# Patient Record
Sex: Female | Born: 1940 | Race: White | Hispanic: No | Marital: Married | Smoking: Never smoker
Health system: Southern US, Community
[De-identification: ages and names within clinical notes are randomized; demographics above are authoritative.]

## PROBLEM LIST (undated history)

## (undated) DIAGNOSIS — S83209A Unspecified tear of unspecified meniscus, current injury, unspecified knee, initial encounter: Secondary | ICD-10-CM

## (undated) DIAGNOSIS — H269 Unspecified cataract: Secondary | ICD-10-CM

## (undated) DIAGNOSIS — M1990 Unspecified osteoarthritis, unspecified site: Secondary | ICD-10-CM

## (undated) DIAGNOSIS — K219 Gastro-esophageal reflux disease without esophagitis: Secondary | ICD-10-CM

## (undated) DIAGNOSIS — IMO0001 Reserved for inherently not codable concepts without codable children: Secondary | ICD-10-CM

## (undated) HISTORY — PX: ABDOMINAL HYSTERECTOMY: SHX81

## (undated) HISTORY — DX: Unspecified cataract: H26.9

---

## 1998-12-22 ENCOUNTER — Other Ambulatory Visit: Admission: RE | Admit: 1998-12-22 | Discharge: 1998-12-22 | Payer: Self-pay | Admitting: Obstetrics and Gynecology

## 2000-02-09 ENCOUNTER — Other Ambulatory Visit: Admission: RE | Admit: 2000-02-09 | Discharge: 2000-02-09 | Payer: Self-pay | Admitting: Obstetrics and Gynecology

## 2000-05-11 ENCOUNTER — Inpatient Hospital Stay (HOSPITAL_COMMUNITY): Admission: RE | Admit: 2000-05-11 | Discharge: 2000-05-15 | Payer: Self-pay | Admitting: Obstetrics and Gynecology

## 2000-05-13 ENCOUNTER — Encounter: Payer: Self-pay | Admitting: Obstetrics and Gynecology

## 2001-09-12 ENCOUNTER — Other Ambulatory Visit: Admission: RE | Admit: 2001-09-12 | Discharge: 2001-09-12 | Payer: Self-pay | Admitting: Obstetrics and Gynecology

## 2004-08-25 ENCOUNTER — Other Ambulatory Visit: Admission: RE | Admit: 2004-08-25 | Discharge: 2004-08-25 | Payer: Self-pay | Admitting: Obstetrics and Gynecology

## 2004-08-25 ENCOUNTER — Encounter: Admission: RE | Admit: 2004-08-25 | Discharge: 2004-08-25 | Payer: Self-pay | Admitting: Obstetrics and Gynecology

## 2004-09-09 ENCOUNTER — Other Ambulatory Visit: Admission: RE | Admit: 2004-09-09 | Discharge: 2004-09-09 | Payer: Self-pay | Admitting: Diagnostic Radiology

## 2004-09-09 ENCOUNTER — Encounter: Admission: RE | Admit: 2004-09-09 | Discharge: 2004-09-09 | Payer: Self-pay | Admitting: General Surgery

## 2012-04-24 DIAGNOSIS — M25569 Pain in unspecified knee: Secondary | ICD-10-CM | POA: Diagnosis not present

## 2012-07-10 DIAGNOSIS — M25569 Pain in unspecified knee: Secondary | ICD-10-CM | POA: Diagnosis not present

## 2012-07-18 DIAGNOSIS — Z23 Encounter for immunization: Secondary | ICD-10-CM | POA: Diagnosis not present

## 2012-07-24 DIAGNOSIS — M25569 Pain in unspecified knee: Secondary | ICD-10-CM | POA: Diagnosis not present

## 2012-07-31 DIAGNOSIS — IMO0002 Reserved for concepts with insufficient information to code with codable children: Secondary | ICD-10-CM | POA: Diagnosis not present

## 2012-09-25 DIAGNOSIS — M25569 Pain in unspecified knee: Secondary | ICD-10-CM | POA: Diagnosis not present

## 2012-11-02 ENCOUNTER — Ambulatory Visit (INDEPENDENT_AMBULATORY_CARE_PROVIDER_SITE_OTHER): Payer: Medicare Other | Admitting: Family Medicine

## 2012-11-02 VITALS — BP 130/80 | HR 66 | Temp 97.2°F | Resp 16 | Ht 62.0 in | Wt 121.4 lb

## 2012-11-02 DIAGNOSIS — H9390 Unspecified disorder of ear, unspecified ear: Secondary | ICD-10-CM | POA: Diagnosis not present

## 2012-11-02 DIAGNOSIS — H9191 Unspecified hearing loss, right ear: Secondary | ICD-10-CM

## 2012-11-02 DIAGNOSIS — H6121 Impacted cerumen, right ear: Secondary | ICD-10-CM

## 2012-11-02 DIAGNOSIS — H6120 Impacted cerumen, unspecified ear: Secondary | ICD-10-CM | POA: Diagnosis not present

## 2012-11-02 NOTE — Progress Notes (Signed)
  [facility]:  Office Visit  Chief Complaint:  Chief Complaint  Patient presents with  . Cough  . Cerumen Impaction    x 2 days    HPI: Bonnie Cowan is a 72 y.o. female who complains of  2 day history of loss of hearing. No dizziness. She has a h/o ringing in her ears but that has not changed. No HA, no gait changes. Recent URI with a lot of coughing. Nothing makes it worse or better. She has not tried anything for it. Denies ear pain.   History reviewed. No pertinent past medical history. Past Surgical History  Procedure Laterality Date  . Abdominal hysterectomy     History   Social History  . Marital Status: Married    Spouse Name: N/A    Number of Children: N/A  . Years of Education: N/A   Social History Main Topics  . Smoking status: Never Smoker   . Smokeless tobacco: None  . Alcohol Use: None  . Drug Use: None  . Sexually Active: None   Other Topics Concern  . None   Social History Narrative  . None   Family History  Problem Relation Age of Onset  . Heart disease Mother    No Known Allergies Prior to Admission medications   Not on File     ROS: The patient denies fevers, chills, night sweats, unintentional weight loss, chest pain, palpitations, wheezing, dyspnea on exertion, nausea, vomiting, abdominal pain, dysuria, hematuria, melena, numbness, weakness, or tingling.   All other systems have been reviewed and were otherwise negative with the exception of those mentioned in the HPI and as above.    PHYSICAL EXAM: Filed Vitals:   11/02/12 1442  BP: 130/80  Pulse: 66  Temp: 97.2 F (36.2 C)  Resp: 16   Filed Vitals:   11/02/12 1442  Height: 5\' 2"  (1.575 m)  Weight: 121 lb 6.4 oz (55.067 kg)   Body mass index is 22.2 kg/(m^2).  General: Alert, no acute distress HEENT:  Normocephalic, atraumatic, oropharynx patent. Right ear cerumen. S/p removal Tm slightly ruptured.  Cardiovascular:  Regular rate and rhythm, no rubs murmurs  or gallops.  No Carotid bruits, radial pulse intact. No pedal edema.  Respiratory: Clear to auscultation bilaterally.  No wheezes, rales, or rhonchi.  No cyanosis, no use of accessory musculature GI: No organomegaly, abdomen is soft and non-tender, positive bowel sounds.  No masses. Skin: No rashes. Neurologic: Facial musculature symmetric. Psychiatric: Patient is appropriate throughout our interaction. Lymphatic: No cervical lymphadenopathy Musculoskeletal: Gait intact.   LABS: No results found for this or any previous visit.   EKG/XRAY:   Primary read interpreted by Dr. Conley Rolls at [facility].   ASSESSMENT/PLAN: Encounter Diagnoses  Name Primary?  . Hearing loss of right ear Yes  . Cerumen impaction, right    Patient declined hearing test Wax removed without complications Hears better although still failed whisper test F/u prn    LE, THAO PHUONG, DO 11/02/2012 3:20 PM

## 2012-11-26 DIAGNOSIS — Z1231 Encounter for screening mammogram for malignant neoplasm of breast: Secondary | ICD-10-CM | POA: Diagnosis not present

## 2012-11-26 DIAGNOSIS — Z780 Asymptomatic menopausal state: Secondary | ICD-10-CM | POA: Diagnosis not present

## 2012-12-31 DIAGNOSIS — M25569 Pain in unspecified knee: Secondary | ICD-10-CM | POA: Diagnosis not present

## 2013-01-01 DIAGNOSIS — H1130 Conjunctival hemorrhage, unspecified eye: Secondary | ICD-10-CM | POA: Diagnosis not present

## 2013-02-26 DIAGNOSIS — H2589 Other age-related cataract: Secondary | ICD-10-CM | POA: Diagnosis not present

## 2013-02-26 DIAGNOSIS — H43399 Other vitreous opacities, unspecified eye: Secondary | ICD-10-CM | POA: Diagnosis not present

## 2013-02-26 DIAGNOSIS — H1045 Other chronic allergic conjunctivitis: Secondary | ICD-10-CM | POA: Diagnosis not present

## 2013-04-03 DIAGNOSIS — M25569 Pain in unspecified knee: Secondary | ICD-10-CM | POA: Diagnosis not present

## 2013-05-07 DIAGNOSIS — Z23 Encounter for immunization: Secondary | ICD-10-CM | POA: Diagnosis not present

## 2013-06-20 DIAGNOSIS — IMO0002 Reserved for concepts with insufficient information to code with codable children: Secondary | ICD-10-CM | POA: Diagnosis not present

## 2013-09-25 DIAGNOSIS — IMO0002 Reserved for concepts with insufficient information to code with codable children: Secondary | ICD-10-CM | POA: Diagnosis not present

## 2014-01-01 DIAGNOSIS — M25569 Pain in unspecified knee: Secondary | ICD-10-CM | POA: Diagnosis not present

## 2014-01-01 DIAGNOSIS — Z5189 Encounter for other specified aftercare: Secondary | ICD-10-CM | POA: Diagnosis not present

## 2014-01-02 ENCOUNTER — Encounter (HOSPITAL_COMMUNITY): Payer: Self-pay | Admitting: Emergency Medicine

## 2014-01-02 ENCOUNTER — Inpatient Hospital Stay (HOSPITAL_COMMUNITY)
Admission: EM | Admit: 2014-01-02 | Discharge: 2014-01-05 | DRG: 395 | Disposition: A | Discharge status: Home / Self Care | Payer: Medicare Other | Attending: Internal Medicine | Admitting: Internal Medicine

## 2014-01-02 DIAGNOSIS — K529 Noninfective gastroenteritis and colitis, unspecified: Secondary | ICD-10-CM | POA: Diagnosis present

## 2014-01-02 DIAGNOSIS — R197 Diarrhea, unspecified: Secondary | ICD-10-CM | POA: Diagnosis not present

## 2014-01-02 DIAGNOSIS — K559 Vascular disorder of intestine, unspecified: Secondary | ICD-10-CM | POA: Diagnosis not present

## 2014-01-02 DIAGNOSIS — Z7982 Long term (current) use of aspirin: Secondary | ICD-10-CM

## 2014-01-02 DIAGNOSIS — K5289 Other specified noninfective gastroenteritis and colitis: Secondary | ICD-10-CM | POA: Diagnosis not present

## 2014-01-02 HISTORY — DX: Unspecified tear of unspecified meniscus, current injury, unspecified knee, initial encounter: S83.209A

## 2014-01-02 LAB — COMPREHENSIVE METABOLIC PANEL
ALK PHOS: 71 U/L (ref 39–117)
ALT: 15 U/L (ref 0–35)
AST: 16 U/L (ref 0–37)
Albumin: 4 g/dL (ref 3.5–5.2)
BUN: 29 mg/dL — ABNORMAL HIGH (ref 6–23)
CO2: 19 mEq/L (ref 19–32)
Calcium: 10.5 mg/dL (ref 8.4–10.5)
Chloride: 100 mEq/L (ref 96–112)
Creatinine, Ser: 0.66 mg/dL (ref 0.50–1.10)
GFR calc non Af Amer: 86 mL/min — ABNORMAL LOW (ref >90)
GLUCOSE: 194 mg/dL — AB (ref 70–99)
POTASSIUM: 4.1 meq/L (ref 3.7–5.3)
Sodium: 139 mEq/L (ref 137–147)
TOTAL PROTEIN: 7.1 g/dL (ref 6.0–8.3)
Total Bilirubin: 1.3 mg/dL — ABNORMAL HIGH (ref 0.3–1.2)

## 2014-01-02 LAB — CBC WITH DIFFERENTIAL/PLATELET
Basophils Absolute: 0 10*3/uL (ref 0.0–0.1)
Basophils Relative: 0 % (ref 0–1)
EOS ABS: 0 10*3/uL (ref 0.0–0.7)
Eosinophils Relative: 0 % (ref 0–5)
HCT: 42.4 % (ref 36.0–46.0)
HEMOGLOBIN: 14.4 g/dL (ref 12.0–15.0)
LYMPHS PCT: 8 % — AB (ref 12–46)
Lymphs Abs: 1.3 10*3/uL (ref 0.7–4.0)
MCH: 29.9 pg (ref 26.0–34.0)
MCHC: 34 g/dL (ref 30.0–36.0)
MCV: 88.1 fL (ref 78.0–100.0)
MONOS PCT: 5 % (ref 3–12)
Monocytes Absolute: 0.8 10*3/uL (ref 0.1–1.0)
NEUTROS PCT: 87 % — AB (ref 43–77)
Neutro Abs: 13.1 10*3/uL — ABNORMAL HIGH (ref 1.7–7.7)
PLATELETS: 324 10*3/uL (ref 150–400)
RBC: 4.81 MIL/uL (ref 3.87–5.11)
RDW: 12.8 % (ref 11.5–15.5)
WBC: 15.2 10*3/uL — AB (ref 4.0–10.5)

## 2014-01-02 LAB — LIPASE, BLOOD: Lipase: 25 U/L (ref 11–59)

## 2014-01-02 MED ORDER — SODIUM CHLORIDE 0.9 % IV BOLUS (SEPSIS)
1000.0000 mL | Freq: Once | INTRAVENOUS | Status: AC
  Administered 2014-01-03: 1000 mL via INTRAVENOUS

## 2014-01-02 MED ORDER — IOHEXOL 300 MG/ML  SOLN
25.0000 mL | Freq: Once | INTRAMUSCULAR | Status: AC | PRN
  Administered 2014-01-02: 25 mL via ORAL

## 2014-01-02 MED ORDER — ONDANSETRON 4 MG PO TBDP
8.0000 mg | ORAL_TABLET | Freq: Once | ORAL | Status: AC
  Administered 2014-01-02: 8 mg via ORAL
  Filled 2014-01-02: qty 2

## 2014-01-02 NOTE — ED Notes (Signed)
Pt placed in gown and in bed. Pt monitored by 5-lead, bp cuff, and pulse ox.

## 2014-01-02 NOTE — ED Notes (Signed)
Dr. Canary Brim at the bedside.

## 2014-01-02 NOTE — ED Notes (Addendum)
Pt states she has been nauseated all day since 0400 this morning and each time she goes to the bathroom it is runny stool. States there was some bright red blood in the commode as well. C/o some lower abd cramping as well.

## 2014-01-02 NOTE — ED Provider Notes (Signed)
CSN: [id number]     Arrival date & time 01/02/14  1844 History   First MD Initiated Contact with Patient 01/02/14 2302     Chief Complaint  Patient presents with  . Nausea  . Diarrhea     (Consider location/radiation/quality/duration/timing/severity/associated sxs/prior Treatment) HPI Pt presents with c/o nausea and diarrhea.  She has had generalied fatigue associated with these symptoms.  Today she saw some blood in her stool which prompted ED visit. Symptoms began this morning and 4am and have been constant.  She states she has passed large amounts of liquid stool today.  No fever/chills.  No vomiting, has been able to drink liquids,  Intermittent cramping abdominal pain.  No recent travel or sick contacts.  Has not had any treatment prior to arrival.  There are no other associated systemic symptoms, there are no other alleviating or modifying factors.   Past Medical History  Diagnosis Date  . Meniscus tear     right knee   Past Surgical History  Procedure Laterality Date  . Abdominal hysterectomy     Family History  Problem Relation Age of Onset  . Heart disease Mother    History  Substance Use Topics  . Smoking status: Never Smoker   . Smokeless tobacco: Not on file  . Alcohol Use: Not on file   OB History   Grav Para Term Preterm Abortions TAB SAB Ect Mult Living                 Review of Systems ROS reviewed and all otherwise negative except for mentioned in HPI    Allergies  Review of patient's allergies indicates no known allergies.  Home Medications   Prior to Admission medications   Medication Sig Start Date End Date Taking? Authorizing Provider  Multiple Vitamin (MULTI VITAMIN DAILY PO) Take 1 tablet by mouth daily.   Yes Historical Provider, MD  acetaminophen (TYLENOL) 500 MG tablet Take 1 tablet (500 mg total) by mouth every 6 (six) hours as needed for mild pain. 01/05/14   Shanker Kristeen Mans, MD  aspirin EC 81 MG tablet Take 1 tablet (81 mg total) by  mouth daily. 01/12/14   Shanker Kristeen Mans, MD  ciprofloxacin (CIPRO) 500 MG tablet Take 1 tablet (500 mg total) by mouth 2 (two) times daily. 01/05/14   Shanker Kristeen Mans, MD  metroNIDAZOLE (FLAGYL) 500 MG tablet Take 1 tablet (500 mg total) by mouth 3 (three) times daily. 01/05/14   Shanker Kristeen Mans, MD  ondansetron (ZOFRAN) 4 MG tablet Take 1 tablet (4 mg total) by mouth every 8 (eight) hours as needed for nausea or vomiting. 01/05/14   Jonetta Osgood, MD   BP 110/40  Pulse 86  Temp(Src) 97.8 F (36.6 C) (Oral)  Resp 15  Ht 5\' 2"  (1.575 m)  Wt 121 lb 0.5 oz (54.9 kg)  BMI 22.13 kg/m2  SpO2 100% Vitals reviewed Physical Exam Physical Examination: General appearance - alert, well appearing, and in no distress Mental status - alert, oriented to person, place, and time Eyes - no scleral icterus, no conjunctival injection Mouth - mucous membranes moist, pharynx normal without lesions Chest - clear to auscultation, no wheezes, rales or rhonchi, symmetric air entry Heart - normal rate, regular rhythm, normal S1, S2, no murmurs, rubs, clicks or gallops Abdomen - soft, ttp in bilateral lower abdomen, no gaurding or rebound tenderness, nondistended, no masses or organomegaly, nabs Extremities - peripheral pulses normal, no pedal edema, no clubbing or cyanosis Skin -  normal coloration and turgor, no rashes  ED Course  Procedures (including critical care time)  2:56 AM d/w [facility], Dr. Alcario Drought for admssion to hospitalist service.  Pt is continuing to feel weak and nauseated, she is agreeable with plan for admission.  Labs Review Labs Reviewed  CBC WITH DIFFERENTIAL - Abnormal; Notable for the following:    WBC 15.2 (*)    Neutrophils Relative % 87 (*)    Neutro Abs 13.1 (*)    Lymphocytes Relative 8 (*)    All other components within normal limits  COMPREHENSIVE METABOLIC PANEL - Abnormal; Notable for the following:    Glucose, Bld 194 (*)    BUN 29 (*)    Total Bilirubin 1.3 (*)    GFR  calc non Af Amer 86 (*)    All other components within normal limits  URINALYSIS, ROUTINE W REFLEX MICROSCOPIC - Abnormal; Notable for the following:    Hgb urine dipstick MODERATE (*)    Protein, ur 30 (*)    All other components within normal limits  LACTIC ACID, PLASMA - Abnormal; Notable for the following:    Lactic Acid, Venous 2.6 (*)    All other components within normal limits  BASIC METABOLIC PANEL - Abnormal; Notable for the following:    Glucose, Bld 130 (*)    GFR calc non Af Amer 89 (*)    All other components within normal limits  CBC - Abnormal; Notable for the following:    WBC 15.0 (*)    All other components within normal limits  BASIC METABOLIC PANEL - Abnormal; Notable for the following:    Potassium 3.5 (*)    Glucose, Bld 108 (*)    GFR calc non Af Amer 88 (*)    All other components within normal limits  CBC - Abnormal; Notable for the following:    WBC 14.2 (*)    Hemoglobin 11.3 (*)    HCT 35.0 (*)    All other components within normal limits  BASIC METABOLIC PANEL - Abnormal; Notable for the following:    Potassium 3.3 (*)    Glucose, Bld 104 (*)    GFR calc non Af Amer 87 (*)    All other components within normal limits  CBC - Abnormal; Notable for the following:    WBC 14.7 (*)    Hemoglobin 11.8 (*)    HCT 35.4 (*)    All other components within normal limits  POC OCCULT BLOOD, ED - Abnormal; Notable for the following:    Fecal Occult Bld POSITIVE (*)    All other components within normal limits  STOOL CULTURE  CLOSTRIDIUM DIFFICILE BY PCR  LIPASE, BLOOD  URINE MICROSCOPIC-ADD ON  MAGNESIUM    Imaging Review No results found.   EKG Interpretation None      MDM   Final diagnoses:  Colitis    Pt presenting with abdominal pain and diarrhea with nausea.  hemocult positive stool.  Abdomen tender.  Abdominal CT scan shows colitis.  Pt is continuing to feel fatigued and nauseated, would prefer admission.  Started on cipro and flagyl.   Family and patient updated at the bedside and they are agreeable with the plan.     Threasa Beards, MD 01/06/14 [phone]

## 2014-01-02 NOTE — ED Notes (Signed)
Patient reports last bloody stool at 1800 this evening,

## 2014-01-03 ENCOUNTER — Emergency Department (HOSPITAL_COMMUNITY): Payer: Medicare Other

## 2014-01-03 ENCOUNTER — Encounter (HOSPITAL_COMMUNITY): Payer: Self-pay | Admitting: Radiology

## 2014-01-03 DIAGNOSIS — Z7982 Long term (current) use of aspirin: Secondary | ICD-10-CM | POA: Diagnosis not present

## 2014-01-03 DIAGNOSIS — R933 Abnormal findings on diagnostic imaging of other parts of digestive tract: Secondary | ICD-10-CM | POA: Diagnosis not present

## 2014-01-03 DIAGNOSIS — K625 Hemorrhage of anus and rectum: Secondary | ICD-10-CM | POA: Diagnosis not present

## 2014-01-03 DIAGNOSIS — K529 Noninfective gastroenteritis and colitis, unspecified: Secondary | ICD-10-CM | POA: Diagnosis present

## 2014-01-03 DIAGNOSIS — K559 Vascular disorder of intestine, unspecified: Secondary | ICD-10-CM | POA: Diagnosis present

## 2014-01-03 DIAGNOSIS — K5289 Other specified noninfective gastroenteritis and colitis: Secondary | ICD-10-CM | POA: Diagnosis not present

## 2014-01-03 DIAGNOSIS — R197 Diarrhea, unspecified: Secondary | ICD-10-CM | POA: Diagnosis not present

## 2014-01-03 LAB — BASIC METABOLIC PANEL
BUN: 18 mg/dL (ref 6–23)
CHLORIDE: 108 meq/L (ref 96–112)
CO2: 20 mEq/L (ref 19–32)
CREATININE: 0.59 mg/dL (ref 0.50–1.10)
Calcium: 8.8 mg/dL (ref 8.4–10.5)
GFR calc non Af Amer: 89 mL/min — ABNORMAL LOW (ref >90)
Glucose, Bld: 130 mg/dL — ABNORMAL HIGH (ref 70–99)
Potassium: 3.9 mEq/L (ref 3.7–5.3)
Sodium: 141 mEq/L (ref 137–147)

## 2014-01-03 LAB — MAGNESIUM: Magnesium: 1.8 mg/dL (ref 1.5–2.5)

## 2014-01-03 LAB — CBC
HCT: 37.6 % (ref 36.0–46.0)
HEMOGLOBIN: 12.4 g/dL (ref 12.0–15.0)
MCH: 29.8 pg (ref 26.0–34.0)
MCHC: 33 g/dL (ref 30.0–36.0)
MCV: 90.4 fL (ref 78.0–100.0)
Platelets: 269 10*3/uL (ref 150–400)
RBC: 4.16 MIL/uL (ref 3.87–5.11)
RDW: 13.1 % (ref 11.5–15.5)
WBC: 15 10*3/uL — ABNORMAL HIGH (ref 4.0–10.5)

## 2014-01-03 LAB — URINALYSIS, ROUTINE W REFLEX MICROSCOPIC
BILIRUBIN URINE: NEGATIVE
Glucose, UA: NEGATIVE mg/dL
KETONES UR: NEGATIVE mg/dL
Leukocytes, UA: NEGATIVE
NITRITE: NEGATIVE
Protein, ur: 30 mg/dL — AB
SPECIFIC GRAVITY, URINE: 1.027 (ref 1.005–1.030)
UROBILINOGEN UA: 0.2 mg/dL (ref 0.0–1.0)
pH: 5.5 (ref 5.0–8.0)

## 2014-01-03 LAB — CLOSTRIDIUM DIFFICILE BY PCR: Toxigenic C. Difficile by PCR: NEGATIVE

## 2014-01-03 LAB — LACTIC ACID, PLASMA: LACTIC ACID, VENOUS: 2.6 mmol/L — AB (ref 0.5–2.2)

## 2014-01-03 LAB — URINE MICROSCOPIC-ADD ON

## 2014-01-03 LAB — POC OCCULT BLOOD, ED: Fecal Occult Bld: POSITIVE — AB

## 2014-01-03 MED ORDER — ONDANSETRON HCL 4 MG/2ML IJ SOLN
4.0000 mg | Freq: Four times a day (QID) | INTRAMUSCULAR | Status: DC | PRN
  Administered 2014-01-03 (×2): 4 mg via INTRAVENOUS
  Filled 2014-01-03 (×2): qty 2

## 2014-01-03 MED ORDER — CIPROFLOXACIN IN D5W 400 MG/200ML IV SOLN
400.0000 mg | Freq: Once | INTRAVENOUS | Status: AC
  Administered 2014-01-03: 400 mg via INTRAVENOUS
  Filled 2014-01-03: qty 200

## 2014-01-03 MED ORDER — PROMETHAZINE HCL 25 MG/ML IJ SOLN
12.5000 mg | Freq: Once | INTRAMUSCULAR | Status: AC
  Administered 2014-01-03: 12.5 mg via INTRAVENOUS
  Filled 2014-01-03: qty 1

## 2014-01-03 MED ORDER — SODIUM CHLORIDE 0.9 % IV SOLN
INTRAVENOUS | Status: DC
  Administered 2014-01-03: 03:00:00 via INTRAVENOUS

## 2014-01-03 MED ORDER — CIPROFLOXACIN HCL 750 MG PO TABS
750.0000 mg | ORAL_TABLET | Freq: Two times a day (BID) | ORAL | Status: DC
  Administered 2014-01-03 – 2014-01-05 (×5): 750 mg via ORAL
  Filled 2014-01-03 (×9): qty 1

## 2014-01-03 MED ORDER — METRONIDAZOLE 500 MG PO TABS
500.0000 mg | ORAL_TABLET | Freq: Three times a day (TID) | ORAL | Status: DC
  Administered 2014-01-03 – 2014-01-05 (×7): 500 mg via ORAL
  Filled 2014-01-03 (×11): qty 1

## 2014-01-03 MED ORDER — SODIUM CHLORIDE 0.9 % IV SOLN: INTRAVENOUS | Status: DC

## 2014-01-03 MED ORDER — HYDROMORPHONE HCL PF 1 MG/ML IJ SOLN
0.5000 mg | INTRAMUSCULAR | Status: DC | PRN
  Administered 2014-01-03: 1 mg via INTRAVENOUS
  Filled 2014-01-03: qty 1

## 2014-01-03 MED ORDER — PROMETHAZINE HCL 25 MG/ML IJ SOLN
12.5000 mg | Freq: Four times a day (QID) | INTRAMUSCULAR | Status: DC | PRN
  Administered 2014-01-03 – 2014-01-04 (×2): 12.5 mg via INTRAVENOUS
  Filled 2014-01-03 (×3): qty 1

## 2014-01-03 MED ORDER — METRONIDAZOLE IN NACL 5-0.79 MG/ML-% IV SOLN
500.0000 mg | Freq: Once | INTRAVENOUS | Status: AC
  Administered 2014-01-03: 500 mg via INTRAVENOUS
  Filled 2014-01-03: qty 100

## 2014-01-03 MED ORDER — SODIUM CHLORIDE 0.9 % IV SOLN
INTRAVENOUS | Status: AC
  Administered 2014-01-03 – 2014-01-04 (×3): via INTRAVENOUS

## 2014-01-03 MED ORDER — ACETAMINOPHEN 500 MG PO TABS: 1000.0000 mg | ORAL_TABLET | Freq: Every day | ORAL | Status: DC | PRN

## 2014-01-03 MED ORDER — SODIUM CHLORIDE 0.9 % IV BOLUS (SEPSIS)
1000.0000 mL | Freq: Once | INTRAVENOUS | Status: AC
  Administered 2014-01-03: 1000 mL via INTRAVENOUS

## 2014-01-03 MED ORDER — IOHEXOL 300 MG/ML  SOLN
100.0000 mL | Freq: Once | INTRAMUSCULAR | Status: AC | PRN
  Administered 2014-01-03: 100 mL via INTRAVENOUS

## 2014-01-03 NOTE — H&P (Signed)
[facility] History and Physical  Bonnie Cowan GNO:[id number] DOB: 19-Feb-1941 DOA: 01/02/2014  Referring physician: EDP PCP: No PCP Per Patient   Chief Complaint: Nausea, diarrhea   HPI: Bonnie Cowan is a 73 y.o. female who presents to the ED with nausea, diarrhea, and some BRBPR as well as lower abdominal cramping.  Symptoms are moderately severe, onset at 0400 in the morning yesterday and have lasted all day without change in severity.  No sick contacts, no recent ABx courses nor hospital stays.  Patient presents to ED as symptoms are not improving.  Review of Systems: Systems reviewed.  As above, otherwise negative  Past Medical History  Diagnosis Date  . Meniscus tear     right knee   Past Surgical History  Procedure Laterality Date  . Abdominal hysterectomy     Social History:  reports that she has never smoked. She does not have any smokeless tobacco history on file. Her alcohol and drug histories are not on file.  No Known Allergies  Family History  Problem Relation Age of Onset  . Heart disease Mother      Prior to Admission medications   Medication Sig Start Date End Date Taking? Authorizing Provider  acetaminophen (TYLENOL) 500 MG tablet Take 1,000 mg by mouth daily as needed for mild pain.   Yes Historical Provider, MD  aspirin EC 81 MG tablet Take 81 mg by mouth daily.   Yes Historical Provider, MD  diphenhydramine-acetaminophen (TYLENOL PM) 25-500 MG TABS Take 1 tablet by mouth at bedtime as needed.   Yes Historical Provider, MD  Multiple Vitamin (MULTI VITAMIN DAILY PO) Take 1 tablet by mouth daily.   Yes Historical Provider, MD   Physical Exam: Filed Vitals:   01/03/14 0300  BP: 160/77  Pulse: 64  Temp:   Resp: 17    BP 160/77  Pulse 64  Temp(Src) 98.8 F (37.1 C) (Oral)  Resp 17  SpO2 95%  General Appearance:    Alert, oriented, no distress, appears stated age  Head:    Normocephalic, atraumatic  Eyes:    PERRL, EOMI, sclera non-icteric        Nose:   Nares without drainage or epistaxis. Mucosa, turbinates normal  Throat:   Moist mucous membranes. Oropharynx without erythema or exudate.  Neck:   Supple. No carotid bruits.  No thyromegaly.  No lymphadenopathy.   Back:     No CVA tenderness, no spinal tenderness  Lungs:     Clear to auscultation bilaterally, without wheezes, rhonchi or rales  Chest wall:    No tenderness to palpitation  Heart:    Regular rate and rhythm without murmurs, gallops, rubs  Abdomen:     Soft, diffuse abdominal tenderness, nondistended, normal bowel sounds, no organomegaly  Genitalia:    deferred  Rectal:    deferred  Extremities:   No clubbing, cyanosis or edema.  Pulses:   2+ and symmetric all extremities  Skin:   Skin color, texture, turgor normal, no rashes or lesions  Lymph nodes:   Cervical, supraclavicular, and axillary nodes normal  Neurologic:   CNII-XII intact. Normal strength, sensation and reflexes      throughout    Labs on Admission:  Basic Metabolic Panel:  Recent Labs Lab 01/02/14 1857  NA 139  K 4.1  CL 100  CO2 19  GLUCOSE 194*  BUN 29*  CREATININE 0.66  CALCIUM 10.5   Liver Function Tests:  Recent Labs Lab 01/02/14 1857  AST 16  ALT 15  ALKPHOS 71  BILITOT 1.3*  PROT 7.1  ALBUMIN 4.0    Recent Labs Lab 01/02/14 1857  LIPASE 25   No results found for this basename: AMMONIA,  in the last 168 hours CBC:  Recent Labs Lab 01/02/14 1857  WBC 15.2*  NEUTROABS 13.1*  HGB 14.4  HCT 42.4  MCV 88.1  PLT 324   Cardiac Enzymes: No results found for this basename: CKTOTAL, CKMB, CKMBINDEX, TROPONINI,  in the last 168 hours  BNP (last 3 results) No results found for this basename: PROBNP,  in the last 8760 hours CBG: No results found for this basename: GLUCAP,  in the last 168 hours  Radiological Exams on Admission: Ct Abdomen Pelvis W Contrast  01/03/2014   CLINICAL DATA:  Low abdominal pain, nausea, diarrhea, bloody stool.  EXAM: CT ABDOMEN AND  PELVIS WITH CONTRAST  TECHNIQUE: Multidetector CT imaging of the abdomen and pelvis was performed using the standard protocol following bolus administration of intravenous contrast.  CONTRAST:  115mL OMNIPAQUE IOHEXOL 300 MG/ML  SOLN  COMPARISON:  None.  FINDINGS: Fibrosis or atelectasis in the lung bases.  There is wall thickening and edema involving the transverse and descending colon. There is associated pericolonic fat stranding. Small amount of free fluid is demonstrated in the pelvis and upper quadrant, likely reactive. Changes are consistent with nonspecific colitis. No pneumatosis or portal venous gas is identified. Changes are likely to represent infectious or inflammatory etiology although inferior mesenteric artery process is not excluded. The inferior mesenteric artery proximally appears patent.  Circumscribed low-attenuation lesion in the lateral segment left lobe of liver measuring 11 mm diameter consistent with a small cyst. No other focal liver lesions. The gallbladder, pancreas, spleen, adrenal glands, kidneys, inferior vena cava, and retroperitoneal lymph nodes are unremarkable. Calcification throughout the aorta and iliac vessels. Stomach and small bowel are not abnormally distended and contrast material flows through to the colon without evidence of obstruction. No free air in the abdomen.  Pelvis: Bladder is decompressed, likely accounting for apparent wall thickening. No evidence of diverticulitis although scattered diverticula are present. Appendix is not identified. No significant pelvic lymphadenopathy. Spondylolysis with mild spondylolisthesis at L4-5. No destructive bone lesions.  IMPRESSION: Diffuse wall thickening and pericolonic infiltration involving the transverse and descending colon consistent with nonspecific colitis, likely to be infectious or inflammatory origin. Small amount of free fluid in the abdomen and pelvis is likely reactive.   Electronically Signed   By: Lucienne Capers M.D.   On: 01/03/2014 01:22    EKG: Independently reviewed.  Assessment/Plan Principal Problem:   Colitis   1. Colitis - Infectious vs ischemic vs inflammatory (less likely as this would be new onset). 1. Lactate ordered and pending 2. Treating empirically as presumed infectious origin with cipro / flagyl IV, convert this to PO later this morning if patient able to tolerate POs. 3. NPO, advance diet to clear liquids when patient ready. 4. zofran and phenergan for nausea, dilaudid for pain. 5. IVF at 125 cc/hr 6. Repeat CBC if patient still here tomorrow to follow leukocytosis.    Code Status: Full Code  Family Communication: Family at bedside Disposition Plan: Admit to obs   Time spent: 50 min  GARDNER, JARED M. [facility] Pager [phone]  If 7AM-7PM, please contact the day team taking care of the patient [email or web address] Password [email or web address] 01/03/2014, 3:11 AM

## 2014-01-03 NOTE — ED Notes (Signed)
Reported to Dr. Canary Brim that patient requests nausea medication.  MD acknowledges, and will input orders.

## 2014-01-03 NOTE — Consult Note (Signed)
Unassigned Consult  Reason for Consult: Hematochezia and abdominal pain Referring Physician: [facility]  Bonnie Cowan HPI: This is a 73 year old female with an acute onset, 4 AM on 01/02/2014, of lower abdominal pain followed by hematochezia.  A CT scan of the ABM/Pelvis reveals a colitis in the distal transverse and proximal descending colon.  The etiology of the colitis is unknown, but she has not responded to the use of antibiotics.  In fact, she reports feeling worse.  Her C. Diff is negative.  No prior issues with hematochezia, recent antibiotic use, and there are no reports of any sick contacts.  She also denies having a prior colonoscopy.  The patient cannot recall the number of blood bowel movements on the day of admission, but she states that she only had two blood bowel movements today.  Past Medical History  Diagnosis Date  . Meniscus tear     right knee    Past Surgical History  Procedure Laterality Date  . Abdominal hysterectomy      Family History  Problem Relation Age of Onset  . Heart disease Mother     Social History:  reports that she has never smoked. She does not have any smokeless tobacco history on file. Her alcohol and drug histories are not on file.  Allergies: No Known Allergies  Medications:  Scheduled: . ciprofloxacin  750 mg Oral BID  . metroNIDAZOLE  500 mg Oral 3 times per day   Continuous: . sodium chloride 100 mL/hr at 01/03/14 1749    Results for orders placed during the hospital encounter of 01/02/14 (from the past 24 hour(s))  CBC WITH DIFFERENTIAL     Status: Abnormal   Collection Time    01/02/14  6:57 PM      Result Value Ref Range   WBC 15.2 (*) 4.0 - 10.5 K/uL   RBC 4.81  3.87 - 5.11 MIL/uL   Hemoglobin 14.4  12.0 - 15.0 g/dL   HCT 42.4  36.0 - 46.0 %   MCV 88.1  78.0 - 100.0 fL   MCH 29.9  26.0 - 34.0 pg   MCHC 34.0  30.0 - 36.0 g/dL   RDW 12.8  11.5 - 15.5 %   Platelets 324  150 - 400 K/uL   Neutrophils Relative % 87  (*) 43 - 77 %   Neutro Abs 13.1 (*) 1.7 - 7.7 K/uL   Lymphocytes Relative 8 (*) 12 - 46 %   Lymphs Abs 1.3  0.7 - 4.0 K/uL   Monocytes Relative 5  3 - 12 %   Monocytes Absolute 0.8  0.1 - 1.0 K/uL   Eosinophils Relative 0  0 - 5 %   Eosinophils Absolute 0.0  0.0 - 0.7 K/uL   Basophils Relative 0  0 - 1 %   Basophils Absolute 0.0  0.0 - 0.1 K/uL  COMPREHENSIVE METABOLIC PANEL     Status: Abnormal   Collection Time    01/02/14  6:57 PM      Result Value Ref Range   Sodium 139  137 - 147 mEq/L   Potassium 4.1  3.7 - 5.3 mEq/L   Chloride 100  96 - 112 mEq/L   CO2 19  19 - 32 mEq/L   Glucose, Bld 194 (*) 70 - 99 mg/dL   BUN 29 (*) 6 - 23 mg/dL   Creatinine, Ser 0.66  0.50 - 1.10 mg/dL   Calcium 10.5  8.4 - 10.5 mg/dL  Total Protein 7.1  6.0 - 8.3 g/dL   Albumin 4.0  3.5 - 5.2 g/dL   AST 16  0 - 37 U/L   ALT 15  0 - 35 U/L   Alkaline Phosphatase 71  39 - 117 U/L   Total Bilirubin 1.3 (*) 0.3 - 1.2 mg/dL   GFR calc non Af Amer 86 (*) >90 mL/min   GFR calc Af Amer >90  >90 mL/min  LIPASE, BLOOD     Status: None   Collection Time    01/02/14  6:57 PM      Result Value Ref Range   Lipase 25  11 - 59 U/L  URINALYSIS, ROUTINE W REFLEX MICROSCOPIC     Status: Abnormal   Collection Time    01/02/14 11:45 PM      Result Value Ref Range   Color, Urine YELLOW  YELLOW   APPearance CLEAR  CLEAR   Specific Gravity, Urine 1.027  1.005 - 1.030   pH 5.5  5.0 - 8.0   Glucose, UA NEGATIVE  NEGATIVE mg/dL   Hgb urine dipstick MODERATE (*) NEGATIVE   Bilirubin Urine NEGATIVE  NEGATIVE   Ketones, ur NEGATIVE  NEGATIVE mg/dL   Protein, ur 30 (*) NEGATIVE mg/dL   Urobilinogen, UA 0.2  0.0 - 1.0 mg/dL   Nitrite NEGATIVE  NEGATIVE   Leukocytes, UA NEGATIVE  NEGATIVE  URINE MICROSCOPIC-ADD ON     Status: None   Collection Time    01/02/14 11:45 PM      Result Value Ref Range   Squamous Epithelial / LPF RARE  RARE   RBC / HPF 3-6  <3 RBC/hpf   Bacteria, UA RARE  RARE  POC OCCULT BLOOD, ED      Status: Abnormal   Collection Time    01/03/14 12:15 AM      Result Value Ref Range   Fecal Occult Bld POSITIVE (*) NEGATIVE  CLOSTRIDIUM DIFFICILE BY PCR     Status: None   Collection Time    01/03/14  1:56 AM      Result Value Ref Range   C difficile by pcr NEGATIVE  NEGATIVE  LACTIC ACID, PLASMA     Status: Abnormal   Collection Time    01/03/14  3:12 AM      Result Value Ref Range   Lactic Acid, Venous 2.6 (*) 0.5 - 2.2 mmol/L  BASIC METABOLIC PANEL     Status: Abnormal   Collection Time    01/03/14  8:05 AM      Result Value Ref Range   Sodium 141  137 - 147 mEq/L   Potassium 3.9  3.7 - 5.3 mEq/L   Chloride 108  96 - 112 mEq/L   CO2 20  19 - 32 mEq/L   Glucose, Bld 130 (*) 70 - 99 mg/dL   BUN 18  6 - 23 mg/dL   Creatinine, Ser 0.59  0.50 - 1.10 mg/dL   Calcium 8.8  8.4 - 10.5 mg/dL   GFR calc non Af Amer 89 (*) >90 mL/min   GFR calc Af Amer >90  >90 mL/min  MAGNESIUM     Status: None   Collection Time    01/03/14  8:05 AM      Result Value Ref Range   Magnesium 1.8  1.5 - 2.5 mg/dL  CBC     Status: Abnormal   Collection Time    01/03/14  8:05 AM      Result Value  Ref Range   WBC 15.0 (*) 4.0 - 10.5 K/uL   RBC 4.16  3.87 - 5.11 MIL/uL   Hemoglobin 12.4  12.0 - 15.0 g/dL   HCT 37.6  36.0 - 46.0 %   MCV 90.4  78.0 - 100.0 fL   MCH 29.8  26.0 - 34.0 pg   MCHC 33.0  30.0 - 36.0 g/dL   RDW 13.1  11.5 - 15.5 %   Platelets 269  150 - 400 K/uL     Ct Abdomen Pelvis W Contrast  01/03/2014   CLINICAL DATA:  Low abdominal pain, nausea, diarrhea, bloody stool.  EXAM: CT ABDOMEN AND PELVIS WITH CONTRAST  TECHNIQUE: Multidetector CT imaging of the abdomen and pelvis was performed using the standard protocol following bolus administration of intravenous contrast.  CONTRAST:  150mL OMNIPAQUE IOHEXOL 300 MG/ML  SOLN  COMPARISON:  None.  FINDINGS: Fibrosis or atelectasis in the lung bases.  There is wall thickening and edema involving the transverse and descending colon. There is  associated pericolonic fat stranding. Small amount of free fluid is demonstrated in the pelvis and upper quadrant, likely reactive. Changes are consistent with nonspecific colitis. No pneumatosis or portal venous gas is identified. Changes are likely to represent infectious or inflammatory etiology although inferior mesenteric artery process is not excluded. The inferior mesenteric artery proximally appears patent.  Circumscribed low-attenuation lesion in the lateral segment left lobe of liver measuring 11 mm diameter consistent with a small cyst. No other focal liver lesions. The gallbladder, pancreas, spleen, adrenal glands, kidneys, inferior vena cava, and retroperitoneal lymph nodes are unremarkable. Calcification throughout the aorta and iliac vessels. Stomach and small bowel are not abnormally distended and contrast material flows through to the colon without evidence of obstruction. No free air in the abdomen.  Pelvis: Bladder is decompressed, likely accounting for apparent wall thickening. No evidence of diverticulitis although scattered diverticula are present. Appendix is not identified. No significant pelvic lymphadenopathy. Spondylolysis with mild spondylolisthesis at L4-5. No destructive bone lesions.  IMPRESSION: Diffuse wall thickening and pericolonic infiltration involving the transverse and descending colon consistent with nonspecific colitis, likely to be infectious or inflammatory origin. Small amount of free fluid in the abdomen and pelvis is likely reactive.   Electronically Signed   By: Lucienne Capers M.D.   On: 01/03/2014 01:22    ROS:  As stated above in the HPI otherwise negative.  Blood pressure 142/69, pulse 68, temperature 98.8 F (37.1 C), temperature source Oral, resp. rate 16, height 5\' 2"  (1.575 m), weight 121 lb 0.5 oz (54.9 kg), SpO2 97.00%.    PE: Gen: NAD, Alert and Oriented HEENT:  [place]/AT, EOMI Neck: Supple, no LAD Lungs: CTA Bilaterally CV: RRR without M/G/R ABM:  Soft, NTND, +BS Ext: No C/C/E  Assessment/Plan: 1) Probable ischemic colitis. 2) Weakness. 3) Leukocytosis.   The patient's abdomen is benign at this time and she is stable, but weak appearing.  My contention is that she has an ischemic colitis and the only way to find out is with an endoscopic examination, however, she does not feel that she can prep for the examination.    Plan: 1) Continue with antibiotics for now. 2) ? Colonoscopy this weekend versus outpatient follow up.  This will depend on her clinical progress. Jeanise Durfey D 01/03/2014, 10:36 AM

## 2014-01-03 NOTE — ED Notes (Signed)
Patient currently drinking contrast.  

## 2014-01-03 NOTE — Progress Notes (Signed)
Patient Demographics  Bonnie Cowan, is a 73 y.o. female, DOB - [birth year], VWU:[id number]  Admit date - 01/02/2014   Admitting Physician Etta Quill, DO  Outpatient Primary MD for the patient is No PCP Per Patient  LOS - 1   Chief Complaint  Patient presents with  . Nausea  . Diarrhea           Subjective:   [name] today has, No headache, No chest pain, i.e. generalized abdominal pain - but has persistent Nausea with no emesis but positive diarrhea, some blood and mucus in stool, No new weakness tingling or numbness, No Cough - SOB.      Assessment & Plan    1. Severe colitis of unclear etiology. Stool cultures pending, continue bowel rest, IV fluids, IV Cipro Flagyl, remote possibility that this could be inflammatory bowel versus ischemic. Have requested GI physician Dr. hung to evaluate. Continue supportive care for now.      Code Status: Full  Family Communication: None present  Disposition Plan: Most likely home   Procedures CT scan abdomen pelvis   Consults  GI Dr. Benson [place]   Medications  Scheduled Meds: . ciprofloxacin  750 mg Oral BID  . metroNIDAZOLE  500 mg Oral 3 times per day   Continuous Infusions: . sodium chloride 100 mL/hr at 01/03/14 0713   PRN Meds:.acetaminophen, HYDROmorphone (DILAUDID) injection, ondansetron (ZOFRAN) IV, promethazine  DVT Prophylaxis  SCDs    Lab Results  Component Value Date   PLT 269 01/03/2014    Antibiotics     Anti-infectives   Start     Dose/Rate Route Frequency Ordered Stop   01/03/14 1400  metroNIDAZOLE (FLAGYL) tablet 500 mg     500 mg Oral 3 times per day 01/03/14 0255     01/03/14 0800  ciprofloxacin (CIPRO) tablet 750 mg     750 mg Oral 2 times daily 01/03/14 0255     01/03/14 0145  ciprofloxacin (CIPRO) IVPB  400 mg     400 mg 200 mL/hr over 60 Minutes Intravenous  Once 01/03/14 0141 01/03/14 0306   01/03/14 0145  metroNIDAZOLE (FLAGYL) IVPB 500 mg     500 mg 100 mL/hr over 60 Minutes Intravenous  Once 01/03/14 0141 01/03/14 0306          Objective:   Filed Vitals:   01/03/14 0300 01/03/14 0315 01/03/14 0319 01/03/14 0550  BP: 160/77 156/75 171/73 142/69  Pulse: 64 60 64 68  Temp:   98.6 F (37 C) 98.8 F (37.1 C)  TempSrc:   Oral Oral  Resp: 17 19 18 16   Height:   5\' 2"  (1.575 m)   Weight:   54.9 kg (121 lb 0.5 oz)   SpO2: 95% 96% 98% 97%    Wt Readings from Last 3 Encounters:  01/03/14 54.9 kg (121 lb 0.5 oz)  11/02/12 55.067 kg (121 lb 6.4 oz)     Intake/Output Summary (Last 24 hours) at 01/03/14 0953 Last data filed at 01/03/14 0803  Gross per 24 hour  Intake 2364.58 ml  Output    800 ml  Net 1564.58 ml     Physical Exam  Awake Alert, Oriented X 3, No new F.N deficits, Normal affect .AT,PERRAL Supple  Neck,No JVD, No cervical lymphadenopathy appriciated.  Symmetrical Chest wall movement, Good air movement bilaterally, CTAB RRR,No Gallops,Rubs or new Murmurs, No Parasternal Heave +ve B.Sounds, Abd Soft, No tenderness, No organomegaly appriciated, No rebound - guarding or rigidity. Abdominal exam is benign. No Cyanosis, Clubbing or edema, No new Rash or bruise      Data Review   Micro Results Recent Results (from the past 240 hour(s))  CLOSTRIDIUM DIFFICILE BY PCR     Status: None   Collection Time    01/03/14  1:56 AM      Result Value Ref Range Status   C difficile by pcr NEGATIVE  NEGATIVE Final    Radiology Reports Ct Abdomen Pelvis W Contrast  01/03/2014   CLINICAL DATA:  Low abdominal pain, nausea, diarrhea, bloody stool.  EXAM: CT ABDOMEN AND PELVIS WITH CONTRAST  TECHNIQUE: Multidetector CT imaging of the abdomen and pelvis was performed using the standard protocol following bolus administration of intravenous contrast.  CONTRAST:  151mL  OMNIPAQUE IOHEXOL 300 MG/ML  SOLN  COMPARISON:  None.  FINDINGS: Fibrosis or atelectasis in the lung bases.  There is wall thickening and edema involving the transverse and descending colon. There is associated pericolonic fat stranding. Small amount of free fluid is demonstrated in the pelvis and upper quadrant, likely reactive. Changes are consistent with nonspecific colitis. No pneumatosis or portal venous gas is identified. Changes are likely to represent infectious or inflammatory etiology although inferior mesenteric artery process is not excluded. The inferior mesenteric artery proximally appears patent.  Circumscribed low-attenuation lesion in the lateral segment left lobe of liver measuring 11 mm diameter consistent with a small cyst. No other focal liver lesions. The gallbladder, pancreas, spleen, adrenal glands, kidneys, inferior vena cava, and retroperitoneal lymph nodes are unremarkable. Calcification throughout the aorta and iliac vessels. Stomach and small bowel are not abnormally distended and contrast material flows through to the colon without evidence of obstruction. No free air in the abdomen.  Pelvis: Bladder is decompressed, likely accounting for apparent wall thickening. No evidence of diverticulitis although scattered diverticula are present. Appendix is not identified. No significant pelvic lymphadenopathy. Spondylolysis with mild spondylolisthesis at L4-5. No destructive bone lesions.  IMPRESSION: Diffuse wall thickening and pericolonic infiltration involving the transverse and descending colon consistent with nonspecific colitis, likely to be infectious or inflammatory origin. Small amount of free fluid in the abdomen and pelvis is likely reactive.   Electronically Signed   By: Lucienne Capers M.D.   On: 01/03/2014 01:22    CBC  Recent Labs Lab 01/02/14 1857 01/03/14 0805  WBC 15.2* 15.0*  HGB 14.4 12.4  HCT 42.4 37.6  PLT 324 269  MCV 88.1 90.4  MCH 29.9 29.8  MCHC 34.0  33.0  RDW 12.8 13.1  LYMPHSABS 1.3  --   MONOABS 0.8  --   EOSABS 0.0  --   BASOSABS 0.0  --     Chemistries   Recent Labs Lab 01/02/14 1857 01/03/14 0805  NA 139 141  K 4.1 3.9  CL 100 108  CO2 19 20  GLUCOSE 194* 130*  BUN 29* 18  CREATININE 0.66 0.59  CALCIUM 10.5 8.8  MG  --  1.8  AST 16  --   ALT 15  --   ALKPHOS 71  --   BILITOT 1.3*  --    ------------------------------------------------------------------------------------------------------------------ estimated creatinine clearance is 49.5 ml/min (by C-G formula based on Cr of 0.59). ------------------------------------------------------------------------------------------------------------------ No results found for this basename: HGBA1C,  in the last 72 hours ------------------------------------------------------------------------------------------------------------------ No results found for this basename: CHOL, HDL, LDLCALC, TRIG, CHOLHDL, LDLDIRECT,  in the last 72 hours ------------------------------------------------------------------------------------------------------------------ No results found for this basename: TSH, T4TOTAL, FREET3, T3FREE, THYROIDAB,  in the last 72 hours ------------------------------------------------------------------------------------------------------------------ No results found for this basename: VITAMINB12, FOLATE, FERRITIN, TIBC, IRON, RETICCTPCT,  in the last 72 hours  Coagulation profile No results found for this basename: INR, PROTIME,  in the last 168 hours  No results found for this basename: DDIMER,  in the last 72 hours  Cardiac Enzymes No results found for this basename: CK, CKMB, TROPONINI, MYOGLOBIN,  in the last 168 hours ------------------------------------------------------------------------------------------------------------------ No components found with this basename: POCBNP,      Time Spent in minutes   35   SINGH,PRASHANT K M.D on 01/03/2014 at  9:53 AM  Between 7am to 7pm - Pager - [phone]  After 7pm go to [email or web address] - password [email or web address]  And look for the night coverage person covering for me after hours  [facility] Office  [phone]   **Disclaimer: This note may have been dictated with voice recognition software. Similar sounding words can inadvertently be transcribed and this note may contain transcription errors which may not have been corrected upon publication of note.**

## 2014-01-03 NOTE — ED Notes (Signed)
Dr. Alcario Drought, Hospitalist, at the bedside.

## 2014-01-03 NOTE — ED Notes (Signed)
Clarified with Dr. Alcario Drought. Patient is to receive 129mL of NS.  Duplicate order was entered.

## 2014-01-03 NOTE — ED Notes (Signed)
Updated patient and family on the plan of care.

## 2014-01-03 NOTE — ED Notes (Signed)
Reported to Dr. Hetty Blend that hemocult has been sent, with bright red blood on inspection.  In and out cath collected since patient has diarrhea.  No need for 2nd sample of urine at this time .

## 2014-01-03 NOTE — Progress Notes (Signed)
Utilization review completed.  

## 2014-01-04 LAB — CBC
HCT: 35 % — ABNORMAL LOW (ref 36.0–46.0)
Hemoglobin: 11.3 g/dL — ABNORMAL LOW (ref 12.0–15.0)
MCH: 29 pg (ref 26.0–34.0)
MCHC: 32.3 g/dL (ref 30.0–36.0)
MCV: 90 fL (ref 78.0–100.0)
PLATELETS: 251 10*3/uL (ref 150–400)
RBC: 3.89 MIL/uL (ref 3.87–5.11)
RDW: 13.1 % (ref 11.5–15.5)
WBC: 14.2 10*3/uL — ABNORMAL HIGH (ref 4.0–10.5)

## 2014-01-04 LAB — BASIC METABOLIC PANEL
BUN: 11 mg/dL (ref 6–23)
CALCIUM: 8.7 mg/dL (ref 8.4–10.5)
CO2: 22 meq/L (ref 19–32)
CREATININE: 0.61 mg/dL (ref 0.50–1.10)
Chloride: 106 mEq/L (ref 96–112)
GFR calc Af Amer: >90 mL/min (ref >90)
GFR, EST NON AFRICAN AMERICAN: 88 mL/min — AB (ref >90)
GLUCOSE: 108 mg/dL — AB (ref 70–99)
Potassium: 3.5 mEq/L — ABNORMAL LOW (ref 3.7–5.3)
Sodium: 141 mEq/L (ref 137–147)

## 2014-01-04 NOTE — Progress Notes (Signed)
I agree with the above documentation, including the assessment and plan. Slowly improving, expect ischemic colitis Supportive care, outpatient colonoscopy

## 2014-01-04 NOTE — Progress Notes (Signed)
Progress Note  Covering for Dr. Benson [place]   Subjective  still has loose stool but no bleeding this am. Nauseated, especially when trying to get out of bed   Objective   Vital signs in last 24 hours: Temp:  [98.5 F (36.9 C)-98.7 F (37.1 C)] 98.5 F (36.9 C) (06/26 2159) Pulse Rate:  [63-66] 66 (06/27 0711) Resp:  [16] 16 (06/27 0711) BP: (142-165)/(68-74) 142/68 mmHg (06/27 0711) SpO2:  [96 %-98 %] 98 % (06/27 0711) Last BM Date: 01/04/14 General:    white female in NAD Heart:  Regular rate and rhythm Abdomen:  Soft, nontender and nondistended. Normal bowel sounds. Extremities:  Without edema. Neurologic:  Alert and oriented,  grossly normal neurologically. Psych:  Cooperative. Normal mood and affect.  Lab Results:  Recent Labs  01/02/14 1857 01/03/14 0805 01/04/14 0450  WBC 15.2* 15.0* 14.2*  HGB 14.4 12.4 11.3*  HCT 42.4 37.6 35.0*  PLT 324 269 251   BMET  Recent Labs  01/02/14 1857 01/03/14 0805 01/04/14 0450  NA 139 141 141  K 4.1 3.9 3.5*  CL 100 108 106  CO2 19 20 22   GLUCOSE 194* 130* 108*  BUN 29* 18 11  CREATININE 0.66 0.59 0.61  CALCIUM 10.5 8.8 8.7   LFT  Recent Labs  01/02/14 1857  PROT 7.1  ALBUMIN 4.0  AST 16  ALT 15  ALKPHOS 71  BILITOT 1.3*    Studies/Results: Ct Abdomen Pelvis W Contrast  01/03/2014   CLINICAL DATA:  Low abdominal pain, nausea, diarrhea, bloody stool.  EXAM: CT ABDOMEN AND PELVIS WITH CONTRAST  TECHNIQUE: Multidetector CT imaging of the abdomen and pelvis was performed using the standard protocol following bolus administration of intravenous contrast.  CONTRAST:  110mL OMNIPAQUE IOHEXOL 300 MG/ML  SOLN  COMPARISON:  None.  FINDINGS: Fibrosis or atelectasis in the lung bases.  There is wall thickening and edema involving the transverse and descending colon. There is associated pericolonic fat stranding. Small amount of free fluid is demonstrated in the pelvis and upper quadrant, likely reactive. Changes are  consistent with nonspecific colitis. No pneumatosis or portal venous gas is identified. Changes are likely to represent infectious or inflammatory etiology although inferior mesenteric artery process is not excluded. The inferior mesenteric artery proximally appears patent.  Circumscribed low-attenuation lesion in the lateral segment left lobe of liver measuring 11 mm diameter consistent with a small cyst. No other focal liver lesions. The gallbladder, pancreas, spleen, adrenal glands, kidneys, inferior vena cava, and retroperitoneal lymph nodes are unremarkable. Calcification throughout the aorta and iliac vessels. Stomach and small bowel are not abnormally distended and contrast material flows through to the colon without evidence of obstruction. No free air in the abdomen.  Pelvis: Bladder is decompressed, likely accounting for apparent wall thickening. No evidence of diverticulitis although scattered diverticula are present. Appendix is not identified. No significant pelvic lymphadenopathy. Spondylolysis with mild spondylolisthesis at L4-5. No destructive bone lesions.  IMPRESSION: Diffuse wall thickening and pericolonic infiltration involving the transverse and descending colon consistent with nonspecific colitis, likely to be infectious or inflammatory origin. Small amount of free fluid in the abdomen and pelvis is likely reactive.   Electronically Signed   By: Lucienne Capers M.D.   On: 01/03/2014 01:22     Assessment / Plan:   Colitis, distal transverse/proximal descending colon. C-diff negative. Stool culture pending.  Infectious etiology possible. Ischemic colitis also diagnostic consideration. On cipro / flagyl. WBC down slightly and symptoms improved. Continue  antibiotics, supportive care. Never had a colonoscopy but this can be done outpatient is she continues to improve. Admitting team advanced to fulls today, will see how she does. Encouraged her to ask for prn anti-emetics.     LOS: 2 days    Tye Savoy  01/04/2014, 10:23 AM

## 2014-01-04 NOTE — Progress Notes (Addendum)
PATIENT DETAILS Name: Bonnie Cowan Age: 73 y.o. Sex: female Date of Birth: 1941/04/17 Admit Date: 01/02/2014 Admitting Physician Etta Quill, DO PCP:No PCP Per Patient  Subjective: Still having intermittent hematochezia. Diarrhea and abd pain has improved  Assessment/Plan: Principal Problem:   Colitis - Etiology felt to be either infectious or ischemic. Admitted and empirically started on Cipro/Flagyl.C Diff PCR negative. Leukocytosis improving, also improving clinically. Abdomen is soft with very minimal tenderness on the left mid quadrant area. Advanced diet to full liquids today, continue with supportive measures. Follow clinically. Per GI, colonoscopy can be done in the outpatient setting.  Disposition: Remain inpatient  DVT Prophylaxis:  SCD's  Code Status: Full code   Family Communication None at bedside  Procedures:  none  CONSULTS:  GI  Time spent 40 minutes-which includes 50% of the time with face-to-face with patient/ family and coordinating care related to the above assessment and plan.    MEDICATIONS: Scheduled Meds: . ciprofloxacin  750 mg Oral BID  . metroNIDAZOLE  500 mg Oral 3 times per day   Continuous Infusions: . sodium chloride 100 mL/hr at 01/04/14 0831   PRN Meds:.acetaminophen, HYDROmorphone (DILAUDID) injection, ondansetron (ZOFRAN) IV, promethazine  Antibiotics: Anti-infectives   Start     Dose/Rate Route Frequency Ordered Stop   01/03/14 1400  metroNIDAZOLE (FLAGYL) tablet 500 mg     500 mg Oral 3 times per day 01/03/14 0255     01/03/14 0800  ciprofloxacin (CIPRO) tablet 750 mg     750 mg Oral 2 times daily 01/03/14 0255     01/03/14 0145  ciprofloxacin (CIPRO) IVPB 400 mg     400 mg 200 mL/hr over 60 Minutes Intravenous  Once 01/03/14 0141 01/03/14 0306   01/03/14 0145  metroNIDAZOLE (FLAGYL) IVPB 500 mg     500 mg 100 mL/hr over 60 Minutes Intravenous  Once 01/03/14 0141 01/03/14 0306       PHYSICAL  EXAM: Vital signs in last 24 hours: Filed Vitals:   01/03/14 1334 01/03/14 2159 01/04/14 0711 01/04/14 1322  BP: 160/74 165/68 142/68 172/67  Pulse: 64 63 66 71  Temp: 98.7 F (37.1 C) 98.5 F (36.9 C)  97.6 F (36.4 C)  TempSrc: Oral Oral Oral Oral  Resp: 16 16 16 16   Height:      Weight:      SpO2: 98% 96% 98% 98%    Weight change:  Filed Weights   01/03/14 0319  Weight: 54.9 kg (121 lb 0.5 oz)   Body mass index is 22.13 kg/(m^2).   Gen Exam: Awake and alert with clear speech.   Neck: Supple, No JVD.   Chest: B/L Clear.   CVS: S1 S2 Regular, no murmurs.  Abdomen: soft, BS +, minimally tender in LLQ area, non distended.  Extremities: no edema, lower extremities warm to touch. Neurologic: Non Focal.   Skin: No Rash.   Wounds: N/A.    Intake/Output from previous day:  Intake/Output Summary (Last 24 hours) at 01/04/14 1355 Last data filed at 01/04/14 1322  Gross per 24 hour  Intake 3118.33 ml  Output   2200 ml  Net 918.33 ml     LAB RESULTS: CBC  Recent Labs Lab 01/02/14 1857 01/03/14 0805 01/04/14 0450  WBC 15.2* 15.0* 14.2*  HGB 14.4 12.4 11.3*  HCT 42.4 37.6 35.0*  PLT 324 269 251  MCV 88.1 90.4 90.0  MCH 29.9 29.8 29.0  MCHC 34.0 33.0 32.3  RDW 12.8 13.1 13.1  LYMPHSABS 1.3  --   --   MONOABS 0.8  --   --   EOSABS 0.0  --   --   BASOSABS 0.0  --   --     Chemistries   Recent Labs Lab 01/02/14 1857 01/03/14 0805 01/04/14 0450  NA 139 141 141  K 4.1 3.9 3.5*  CL 100 108 106  CO2 19 20 22   GLUCOSE 194* 130* 108*  BUN 29* 18 11  CREATININE 0.66 0.59 0.61  CALCIUM 10.5 8.8 8.7  MG  --  1.8  --     CBG: No results found for this basename: GLUCAP,  in the last 168 hours  GFR Estimated Creatinine Clearance: 49.5 ml/min (by C-G formula based on Cr of 0.61).  Coagulation profile No results found for this basename: INR, PROTIME,  in the last 168 hours  Cardiac Enzymes No results found for this basename: CK, CKMB, TROPONINI,  MYOGLOBIN,  in the last 168 hours  No components found with this basename: POCBNP,  No results found for this basename: DDIMER,  in the last 72 hours No results found for this basename: HGBA1C,  in the last 72 hours No results found for this basename: CHOL, HDL, LDLCALC, TRIG, CHOLHDL, LDLDIRECT,  in the last 72 hours No results found for this basename: TSH, T4TOTAL, FREET3, T3FREE, THYROIDAB,  in the last 72 hours No results found for this basename: VITAMINB12, FOLATE, FERRITIN, TIBC, IRON, RETICCTPCT,  in the last 72 hours  Recent Labs  01/02/14 1857  LIPASE 25    Urine Studies No results found for this basename: UACOL, UAPR, USPG, UPH, UTP, UGL, UKET, UBIL, UHGB, UNIT, UROB, ULEU, UEPI, UWBC, URBC, UBAC, CAST, CRYS, UCOM, BILUA,  in the last 72 hours  MICROBIOLOGY: Recent Results (from the past 240 hour(s))  CLOSTRIDIUM DIFFICILE BY PCR     Status: None   Collection Time    01/03/14  1:56 AM      Result Value Ref Range Status   C difficile by pcr NEGATIVE  NEGATIVE Final    RADIOLOGY STUDIES/RESULTS: Ct Abdomen Pelvis W Contrast  01/03/2014   CLINICAL DATA:  Low abdominal pain, nausea, diarrhea, bloody stool.  EXAM: CT ABDOMEN AND PELVIS WITH CONTRAST  TECHNIQUE: Multidetector CT imaging of the abdomen and pelvis was performed using the standard protocol following bolus administration of intravenous contrast.  CONTRAST:  132mL OMNIPAQUE IOHEXOL 300 MG/ML  SOLN  COMPARISON:  None.  FINDINGS: Fibrosis or atelectasis in the lung bases.  There is wall thickening and edema involving the transverse and descending colon. There is associated pericolonic fat stranding. Small amount of free fluid is demonstrated in the pelvis and upper quadrant, likely reactive. Changes are consistent with nonspecific colitis. No pneumatosis or portal venous gas is identified. Changes are likely to represent infectious or inflammatory etiology although inferior mesenteric artery process is not excluded. The  inferior mesenteric artery proximally appears patent.  Circumscribed low-attenuation lesion in the lateral segment left lobe of liver measuring 11 mm diameter consistent with a small cyst. No other focal liver lesions. The gallbladder, pancreas, spleen, adrenal glands, kidneys, inferior vena cava, and retroperitoneal lymph nodes are unremarkable. Calcification throughout the aorta and iliac vessels. Stomach and small bowel are not abnormally distended and contrast material flows through to the colon without evidence of obstruction. No free air in the abdomen.  Pelvis: Bladder is decompressed, likely accounting for apparent wall thickening. No evidence of diverticulitis although scattered  diverticula are present. Appendix is not identified. No significant pelvic lymphadenopathy. Spondylolysis with mild spondylolisthesis at L4-5. No destructive bone lesions.  IMPRESSION: Diffuse wall thickening and pericolonic infiltration involving the transverse and descending colon consistent with nonspecific colitis, likely to be infectious or inflammatory origin. Small amount of free fluid in the abdomen and pelvis is likely reactive.   Electronically Signed   By: Lucienne Capers M.D.   On: 01/03/2014 01:22    Oren Binet, MD  [facility] Pager:[phone]  If 7PM-7AM, please contact night-coverage [email or web address] Password [email or web address] 01/04/2014, 1:55 PM   LOS: 2 days   **Disclaimer: This note may have been dictated with voice recognition software. Similar sounding words can inadvertently be transcribed and this note may contain transcription errors which may not have been corrected upon publication of note.**

## 2014-01-05 LAB — CBC
HCT: 35.4 % — ABNORMAL LOW (ref 36.0–46.0)
Hemoglobin: 11.8 g/dL — ABNORMAL LOW (ref 12.0–15.0)
MCH: 29.8 pg (ref 26.0–34.0)
MCHC: 33.3 g/dL (ref 30.0–36.0)
MCV: 89.4 fL (ref 78.0–100.0)
Platelets: 253 10*3/uL (ref 150–400)
RBC: 3.96 MIL/uL (ref 3.87–5.11)
RDW: 12.8 % (ref 11.5–15.5)
WBC: 14.7 10*3/uL — AB (ref 4.0–10.5)

## 2014-01-05 LAB — BASIC METABOLIC PANEL
BUN: 11 mg/dL (ref 6–23)
CALCIUM: 8.6 mg/dL (ref 8.4–10.5)
CO2: 21 mEq/L (ref 19–32)
CREATININE: 0.62 mg/dL (ref 0.50–1.10)
Chloride: 102 mEq/L (ref 96–112)
GFR, EST NON AFRICAN AMERICAN: 87 mL/min — AB (ref >90)
Glucose, Bld: 104 mg/dL — ABNORMAL HIGH (ref 70–99)
Potassium: 3.3 mEq/L — ABNORMAL LOW (ref 3.7–5.3)
SODIUM: 139 meq/L (ref 137–147)

## 2014-01-05 MED ORDER — ONDANSETRON HCL 4 MG PO TABS: 4.0000 mg | ORAL_TABLET | Freq: Three times a day (TID) | ORAL | Status: DC | PRN

## 2014-01-05 MED ORDER — POTASSIUM CHLORIDE CRYS ER 20 MEQ PO TBCR
40.0000 meq | EXTENDED_RELEASE_TABLET | Freq: Once | ORAL | Status: AC
  Administered 2014-01-05: 40 meq via ORAL
  Filled 2014-01-05: qty 2

## 2014-01-05 MED ORDER — ACETAMINOPHEN 500 MG PO TABS: 500.0000 mg | ORAL_TABLET | Freq: Four times a day (QID) | ORAL | Status: DC | PRN

## 2014-01-05 MED ORDER — METRONIDAZOLE 500 MG PO TABS: 500.0000 mg | ORAL_TABLET | Freq: Three times a day (TID) | ORAL | Status: DC

## 2014-01-05 MED ORDER — ASPIRIN EC 81 MG PO TBEC: 81.0000 mg | DELAYED_RELEASE_TABLET | Freq: Every day | ORAL | Status: AC

## 2014-01-05 MED ORDER — CIPROFLOXACIN HCL 500 MG PO TABS: 500.0000 mg | ORAL_TABLET | Freq: Two times a day (BID) | ORAL | Status: DC

## 2014-01-05 NOTE — Consult Note (Signed)
I agree with the above documentation, including the assessment and plan. Probable ischemic colitis, improving clinically Do recommend outpatient colonoscopy with Dr. Benson Norway

## 2014-01-05 NOTE — Progress Notes (Signed)
PATIENT DETAILS Name: Bonnie Cowan Age: 73 y.o. Sex: female Date of Birth: Nov 19, 1940 Admit Date: 01/02/2014 Admitting Physician Etta Quill, DO PCP:No PCP Per Patient  Subjective: No further hematochezia. Diarrhea slowed down significantly, no BM last night and this am. Abd pain has almost resolved  Assessment/Plan: Principal Problem:   Colitis - Etiology felt to be either infectious or ischemic. Admitted and empirically started on Cipro/Flagyl.C Diff PCR negative. Leukocytosis persistent but significantly improved today. Abdomen is soft with no tenderness on the left mid quadrant area. Advanced diet to soft  today, continue with supportive measures. If tolerates diet, suspect can be discharged later today. Per GI, colonoscopy can be done in the outpatient setting.  Disposition: Remain inpatient-?discharge later today  DVT Prophylaxis:  SCD's  Code Status: Full code   Family Communication None at bedside  Procedures:  none  CONSULTS:  GI  MEDICATIONS: Scheduled Meds: . ciprofloxacin  750 mg Oral BID  . metroNIDAZOLE  500 mg Oral 3 times per day   Continuous Infusions:   PRN Meds:.acetaminophen, HYDROmorphone (DILAUDID) injection, ondansetron (ZOFRAN) IV, promethazine  Antibiotics: Anti-infectives   Start     Dose/Rate Route Frequency Ordered Stop   01/03/14 1400  metroNIDAZOLE (FLAGYL) tablet 500 mg     500 mg Oral 3 times per day 01/03/14 0255     01/03/14 0800  ciprofloxacin (CIPRO) tablet 750 mg     750 mg Oral 2 times daily 01/03/14 0255     01/03/14 0145  ciprofloxacin (CIPRO) IVPB 400 mg     400 mg 200 mL/hr over 60 Minutes Intravenous  Once 01/03/14 0141 01/03/14 0306   01/03/14 0145  metroNIDAZOLE (FLAGYL) IVPB 500 mg     500 mg 100 mL/hr over 60 Minutes Intravenous  Once 01/03/14 0141 01/03/14 0306       PHYSICAL EXAM: Vital signs in last 24 hours: Filed Vitals:   01/04/14 0711 01/04/14 1322 01/04/14 2140 01/05/14 0626  BP: 142/68  172/67 175/74 171/71  Pulse: 66 71 65 65  Temp:  97.6 F (36.4 C) 98.9 F (37.2 C) 98 F (36.7 C)  TempSrc: Oral Oral Oral Oral  Resp: 16 16 17 17   Height:      Weight:      SpO2: 98% 98% 96% 99%    Weight change:  Filed Weights   01/03/14 0319  Weight: 54.9 kg (121 lb 0.5 oz)   Body mass index is 22.13 kg/(m^2).   Gen Exam: Awake and alert with clear speech.   Neck: Supple, No JVD.   Chest: B/L Clear.   CVS: S1 S2 Regular, no murmurs.  Abdomen: soft, BS +, non tender, non distended.  Extremities: no edema, lower extremities warm to touch. Neurologic: Non Focal.   Skin: No Rash.   Wounds: N/A.    Intake/Output from previous day:  Intake/Output Summary (Last 24 hours) at 01/05/14 0807 Last data filed at 01/05/14 0630  Gross per 24 hour  Intake 1844.17 ml  Output   2050 ml  Net -205.83 ml     LAB RESULTS: CBC  Recent Labs Lab 01/02/14 1857 01/03/14 0805 01/04/14 0450 01/05/14 0639  WBC 15.2* 15.0* 14.2* 14.7*  HGB 14.4 12.4 11.3* 11.8*  HCT 42.4 37.6 35.0* 35.4*  PLT 324 269 251 253  MCV 88.1 90.4 90.0 89.4  MCH 29.9 29.8 29.0 29.8  MCHC 34.0 33.0 32.3 33.3  RDW 12.8 13.1 13.1 12.8  LYMPHSABS 1.3  --   --   --  MONOABS 0.8  --   --   --   EOSABS 0.0  --   --   --   BASOSABS 0.0  --   --   --     Chemistries   Recent Labs Lab 01/02/14 1857 01/03/14 0805 01/04/14 0450 01/05/14 0639  NA 139 141 141 139  K 4.1 3.9 3.5* 3.3*  CL 100 108 106 102  CO2 19 20 22 21   GLUCOSE 194* 130* 108* 104*  BUN 29* 18 11 11   CREATININE 0.66 0.59 0.61 0.62  CALCIUM 10.5 8.8 8.7 8.6  MG  --  1.8  --   --     CBG: No results found for this basename: GLUCAP,  in the last 168 hours  GFR Estimated Creatinine Clearance: 49.5 ml/min (by C-G formula based on Cr of 0.62).  Coagulation profile No results found for this basename: INR, PROTIME,  in the last 168 hours  Cardiac Enzymes No results found for this basename: CK, CKMB, TROPONINI, MYOGLOBIN,  in the  last 168 hours  No components found with this basename: POCBNP,  No results found for this basename: DDIMER,  in the last 72 hours No results found for this basename: HGBA1C,  in the last 72 hours No results found for this basename: CHOL, HDL, LDLCALC, TRIG, CHOLHDL, LDLDIRECT,  in the last 72 hours No results found for this basename: TSH, T4TOTAL, FREET3, T3FREE, THYROIDAB,  in the last 72 hours No results found for this basename: VITAMINB12, FOLATE, FERRITIN, TIBC, IRON, RETICCTPCT,  in the last 72 hours  Recent Labs  01/02/14 1857  LIPASE 25    Urine Studies No results found for this basename: UACOL, UAPR, USPG, UPH, UTP, UGL, UKET, UBIL, UHGB, UNIT, UROB, ULEU, UEPI, UWBC, URBC, UBAC, CAST, CRYS, UCOM, BILUA,  in the last 72 hours  MICROBIOLOGY: Recent Results (from the past 240 hour(s))  STOOL CULTURE     Status: None   Collection Time    01/03/14  1:56 AM      Result Value Ref Range Status   Specimen Description STOOL   Final   Special Requests Normal   Final   Culture     Final   Value: Culture reincubated for better growth     Performed at [facility]   Report Status PENDING   Incomplete  CLOSTRIDIUM DIFFICILE BY PCR     Status: None   Collection Time    01/03/14  1:56 AM      Result Value Ref Range Status   C difficile by pcr NEGATIVE  NEGATIVE Final    RADIOLOGY STUDIES/RESULTS: Ct Abdomen Pelvis W Contrast  01/03/2014   CLINICAL DATA:  Low abdominal pain, nausea, diarrhea, bloody stool.  EXAM: CT ABDOMEN AND PELVIS WITH CONTRAST  TECHNIQUE: Multidetector CT imaging of the abdomen and pelvis was performed using the standard protocol following bolus administration of intravenous contrast.  CONTRAST:  146mL OMNIPAQUE IOHEXOL 300 MG/ML  SOLN  COMPARISON:  None.  FINDINGS: Fibrosis or atelectasis in the lung bases.  There is wall thickening and edema involving the transverse and descending colon. There is associated pericolonic fat stranding. Small amount of free  fluid is demonstrated in the pelvis and upper quadrant, likely reactive. Changes are consistent with nonspecific colitis. No pneumatosis or portal venous gas is identified. Changes are likely to represent infectious or inflammatory etiology although inferior mesenteric artery process is not excluded. The inferior mesenteric artery proximally appears patent.  Circumscribed low-attenuation lesion in  the lateral segment left lobe of liver measuring 11 mm diameter consistent with a small cyst. No other focal liver lesions. The gallbladder, pancreas, spleen, adrenal glands, kidneys, inferior vena cava, and retroperitoneal lymph nodes are unremarkable. Calcification throughout the aorta and iliac vessels. Stomach and small bowel are not abnormally distended and contrast material flows through to the colon without evidence of obstruction. No free air in the abdomen.  Pelvis: Bladder is decompressed, likely accounting for apparent wall thickening. No evidence of diverticulitis although scattered diverticula are present. Appendix is not identified. No significant pelvic lymphadenopathy. Spondylolysis with mild spondylolisthesis at L4-5. No destructive bone lesions.  IMPRESSION: Diffuse wall thickening and pericolonic infiltration involving the transverse and descending colon consistent with nonspecific colitis, likely to be infectious or inflammatory origin. Small amount of free fluid in the abdomen and pelvis is likely reactive.   Electronically Signed   By: Lucienne Capers M.D.   On: 01/03/2014 01:22    Oren Binet, MD  [facility] Pager:[phone]  If 7PM-7AM, please contact night-coverage [email or web address] Password [email or web address] 01/05/2014, 8:07 AM   LOS: 3 days   **Disclaimer: This note may have been dictated with voice recognition software. Similar sounding words can inadvertently be transcribed and this note may contain transcription errors which may not have been corrected upon publication of  note.**

## 2014-01-05 NOTE — Discharge Instructions (Signed)
Follow with Primary MD  And with Dr Benson Norway (GI)  and other consultant as instructed your Hospitalist MD  Please follow with Dr Loni Muse need a outpatient colonoscopy in a few weeks.  Please get a complete blood count and chemistry panel checked by your Primary MD at your next visit, and again as instructed by your Primary MD.  Get Medicines reviewed and adjusted. Please take all your medications with you for your next visit with your Primary MD  Please request your Primary MD to go over all hospital tests and procedure/radiological results at the follow up, please ask your Primary MD to get all Hospital records sent to his/her office.  If you experience worsening of your admission symptoms, develop shortness of breath, life threatening emergency, suicidal or homicidal thoughts you must seek medical attention immediately by calling 911 or calling your MD immediately  if symptoms less severe.  You must read complete instructions/literature along with all the possible adverse reactions/side effects for all the Medicines you take and that have been prescribed to you. Take any new Medicines after you have completely understood and accpet all the possible adverse reactions/side effects.   Do not drive when taking Pain medications.   Do not take more than prescribed Pain, Sleep and Anxiety Medications  Special Instructions: If you have smoked or chewed Tobacco  in the last 2 yrs please stop smoking, stop any regular Alcohol  and or any Recreational drug use.  Wear Seat belts while driving.  Please note  You were cared for by a hospitalist during your hospital stay. Once you are discharged, your primary care physician will handle any further medical issues. Please note that NO REFILLS for any discharge medications will be authorized once you are discharged, as it is imperative that you return to your primary care physician (or establish a relationship with a primary care physician if you do not have  one) for your aftercare needs so that they can reassess your need for medications and monitor your lab values.

## 2014-01-05 NOTE — Discharge Summary (Signed)
PATIENT DETAILS Name: Bonnie Cowan Age: 73 y.o. Sex: female Date of Birth: January 06, 1941 MRN: [id number]. Admit Date: 01/02/2014 Admitting Physician: Etta Quill, DO PCP:No PCP Per Patient  Recommendations for Outpatient Follow-up:  1. Please repeat CBC/BMET at next visit 2. Please refer patient to Dr  Benson Norway for Colonoscopy  PRIMARY DISCHARGE DIAGNOSIS:  Principal Problem:   Colitis      PAST MEDICAL HISTORY: Past Medical History  Diagnosis Date  . Meniscus tear     right knee    DISCHARGE MEDICATIONS:   Medication List    STOP taking these medications       diphenhydramine-acetaminophen 25-500 MG Tabs  Commonly known as:  TYLENOL PM      TAKE these medications       acetaminophen 500 MG tablet  Commonly known as:  TYLENOL  Take 1 tablet (500 mg total) by mouth every 6 (six) hours as needed for mild pain.     aspirin EC 81 MG tablet  Take 1 tablet (81 mg total) by mouth daily.  Start taking on:  01/12/2014     ciprofloxacin 500 MG tablet  Commonly known as:  CIPRO  Take 1 tablet (500 mg total) by mouth 2 (two) times daily.     metroNIDAZOLE 500 MG tablet  Commonly known as:  FLAGYL  Take 1 tablet (500 mg total) by mouth 3 (three) times daily.     MULTI VITAMIN DAILY PO  Take 1 tablet by mouth daily.     ondansetron 4 MG tablet  Commonly known as:  ZOFRAN  Take 1 tablet (4 mg total) by mouth every 8 (eight) hours as needed for nausea or vomiting.        ALLERGIES:  No Known Allergies  BRIEF HPI:  See H&P, Labs, Consult and Test reports for all details in brief, patient was admitted fornausea, diarrhea, and some BRBPR as well as lower abdominal cramping.   CONSULTATIONS:   GI  PERTINENT RADIOLOGIC STUDIES: Ct Abdomen Pelvis W Contrast  01/03/2014   CLINICAL DATA:  Low abdominal pain, nausea, diarrhea, bloody stool.  EXAM: CT ABDOMEN AND PELVIS WITH CONTRAST  TECHNIQUE: Multidetector CT imaging of the abdomen and pelvis was performed using the  standard protocol following bolus administration of intravenous contrast.  CONTRAST:  128mL OMNIPAQUE IOHEXOL 300 MG/ML  SOLN  COMPARISON:  None.  FINDINGS: Fibrosis or atelectasis in the lung bases.  There is wall thickening and edema involving the transverse and descending colon. There is associated pericolonic fat stranding. Small amount of free fluid is demonstrated in the pelvis and upper quadrant, likely reactive. Changes are consistent with nonspecific colitis. No pneumatosis or portal venous gas is identified. Changes are likely to represent infectious or inflammatory etiology although inferior mesenteric artery process is not excluded. The inferior mesenteric artery proximally appears patent.  Circumscribed low-attenuation lesion in the lateral segment left lobe of liver measuring 11 mm diameter consistent with a small cyst. No other focal liver lesions. The gallbladder, pancreas, spleen, adrenal glands, kidneys, inferior vena cava, and retroperitoneal lymph nodes are unremarkable. Calcification throughout the aorta and iliac vessels. Stomach and small bowel are not abnormally distended and contrast material flows through to the colon without evidence of obstruction. No free air in the abdomen.  Pelvis: Bladder is decompressed, likely accounting for apparent wall thickening. No evidence of diverticulitis although scattered diverticula are present. Appendix is not identified. No significant pelvic lymphadenopathy. Spondylolysis with mild spondylolisthesis at L4-5. No destructive bone lesions.  IMPRESSION: Diffuse wall thickening and pericolonic infiltration involving the transverse and descending colon consistent with nonspecific colitis, likely to be infectious or inflammatory origin. Small amount of free fluid in the abdomen and pelvis is likely reactive.   Electronically Signed   By: Lucienne Capers M.D.   On: 01/03/2014 01:22     PERTINENT LAB RESULTS: CBC:  Recent Labs  01/04/14 0450  01/05/14 0639  WBC 14.2* 14.7*  HGB 11.3* 11.8*  HCT 35.0* 35.4*  PLT 251 253   CMET CMP     Component Value Date/Time   NA 139 01/05/2014 0639   K 3.3* 01/05/2014 0639   CL 102 01/05/2014 0639   CO2 21 01/05/2014 0639   GLUCOSE 104* 01/05/2014 0639   BUN 11 01/05/2014 0639   CREATININE 0.62 01/05/2014 0639   CALCIUM 8.6 01/05/2014 0639   PROT 7.1 01/02/2014 1857   ALBUMIN 4.0 01/02/2014 1857   AST 16 01/02/2014 1857   ALT 15 01/02/2014 1857   ALKPHOS 71 01/02/2014 1857   BILITOT 1.3* 01/02/2014 1857   GFRNONAA 87* 01/05/2014 0639   GFRAA >90 01/05/2014 0639    GFR Estimated Creatinine Clearance: 49.5 ml/min (by C-G formula based on Cr of 0.62).  Recent Labs  01/02/14 1857  LIPASE 25   No results found for this basename: CKTOTAL, CKMB, CKMBINDEX, TROPONINI,  in the last 72 hours No components found with this basename: POCBNP,  No results found for this basename: DDIMER,  in the last 72 hours No results found for this basename: HGBA1C,  in the last 72 hours No results found for this basename: CHOL, HDL, LDLCALC, TRIG, CHOLHDL, LDLDIRECT,  in the last 72 hours No results found for this basename: TSH, T4TOTAL, FREET3, T3FREE, THYROIDAB,  in the last 72 hours No results found for this basename: VITAMINB12, FOLATE, FERRITIN, TIBC, IRON, RETICCTPCT,  in the last 72 hours Coags: No results found for this basename: PT, INR,  in the last 72 hours Microbiology: Recent Results (from the past 240 hour(s))  STOOL CULTURE     Status: None   Collection Time    01/03/14  1:56 AM      Result Value Ref Range Status   Specimen Description STOOL   Final   Special Requests Normal   Final   Culture     Final   Value: NO SUSPICIOUS COLONIES, CONTINUING TO HOLD     Performed at [facility]   Report Status PENDING   Incomplete  CLOSTRIDIUM DIFFICILE BY PCR     Status: None   Collection Time    01/03/14  1:56 AM      Result Value Ref Range Status   C difficile by pcr NEGATIVE  NEGATIVE  Final     BRIEF HOSPITAL COURSE:   Principal Problem: Colitis  - Etiology felt to be either infectious or ischemic. Admitted and empirically started on Cipro/Flagyl.C Diff PCR negative. Leukocytosis persistent but significantly improved today. Abdomen is soft with no tenderness on the left mid quadrant area. Advanced diet to soft on day of discharge, which she has tolerated well. Since clinically improved, tolerating diet, stable for discharge today.  Per GI, colonoscopy can be done in the outpatient setting.I have asked patient to follow up with Dr Benson Norway for a outpatient colonoscopy  TODAY-DAY OF DISCHARGE:  Subjective:   Bonnie Cowan today has no headache,no chest abdominal pain,no new weakness tingling or numbness, feels much better wants to go home today.   Objective:  Blood pressure 110/40, pulse 86, temperature 97.8 F (36.6 C), temperature source Oral, resp. rate 15, height 5\' 2"  (1.575 m), weight 54.9 kg (121 lb 0.5 oz), SpO2 100.00%.  Intake/Output Summary (Last 24 hours) at 01/05/14 1507 Last data filed at 01/05/14 1359  Gross per 24 hour  Intake   1062 ml  Output   1400 ml  Net   -338 ml   Filed Weights   01/03/14 0319  Weight: 54.9 kg (121 lb 0.5 oz)    Exam Awake Alert, Oriented *3, No new F.N deficits, Normal affect [place].AT,PERRAL Supple Neck,No JVD, No cervical lymphadenopathy appriciated.  Symmetrical Chest wall movement, Good air movement bilaterally, CTAB RRR,No Gallops,Rubs or new Murmurs, No Parasternal Heave +ve B.Sounds, Abd Soft, Non tender, No organomegaly appriciated, No rebound -guarding or rigidity. No Cyanosis, Clubbing or edema, No new Rash or bruise  DISCHARGE CONDITION: Stable  DISPOSITION: Home  DISCHARGE INSTRUCTIONS:    Activity:  As tolerated   Diet recommendation: Regular Diet-but soft diet for a week      Discharge Instructions   Diet general    Complete by:  As directed   Stay on a soft diet for 1 week     Increase activity  slowly    Complete by:  As directed            Follow-up Information   Follow up with HUNG,PATRICK D, MD. Schedule an appointment as soon as possible for a visit in 1 week.   Specialty:  Gastroenterology   Contact information:   [address] [place] [address] [phone]      Total Time spent on discharge equals 45 minutes.  SignedOren Binet 01/05/2014 3:07 PM  **Disclaimer: This note may have been dictated with voice recognition software. Similar sounding words can inadvertently be transcribed and this note may contain transcription errors which may not have been corrected upon publication of note.**

## 2014-01-05 NOTE — Progress Notes (Signed)
Discharge instructions gone over with patient. Home medications gone over. Prescriptions given. Follow up appointment to be made. Diet , activity, and signs and symptoms of worsening condition gone over with patient. My chart discussed. Patient verbalized understanding of instructions.

## 2014-01-05 NOTE — Consult Note (Signed)
    Progress Note   Subjective  feels much better today. No bleeding, diarrhea resolving. Tolerating diet   Objective   Vital signs in last 24 hours: Temp:  [97.6 F (36.4 C)-98.9 F (37.2 C)] 98 F (36.7 C) (06/28 0626) Pulse Rate:  [65-71] 65 (06/28 0626) Resp:  [16-17] 17 (06/28 0626) BP: (171-175)/(67-74) 171/71 mmHg (06/28 0626) SpO2:  [96 %-99 %] 99 % (06/28 0626) Last BM Date: 01/05/14 General:    white female in NAD Abdomen:  Soft, mild diffuse lower tenderness, nondistended. Normal bowel sounds. Extremities:  Without edema. Neurologic:  Alert and oriented,  grossly normal neurologically. Psych:  Cooperative. Normal mood and affect.  Lab Results:  Recent Labs  01/03/14 0805 01/04/14 0450 01/05/14 0639  WBC 15.0* 14.2* 14.7*  HGB 12.4 11.3* 11.8*  HCT 37.6 35.0* 35.4*  PLT 269 251 253   BMET  Recent Labs  01/03/14 0805 01/04/14 0450 01/05/14 0639  NA 141 141 139  K 3.9 3.5* 3.3*  CL 108 106 102  CO2 20 22 21   GLUCOSE 130* 108* 104*  BUN 18 11 11   CREATININE 0.59 0.61 0.62  CALCIUM 8.8 8.7 8.6   LFT  Recent Labs  01/02/14 1857  PROT 7.1  ALBUMIN 4.0  AST 16  ALT 15  ALKPHOS 71  BILITOT 1.3*     Assessment / Plan:   Colitis.  C-diff negative. Stool culture normal at 2 days.. Infectious etiology still possible but ischemic colitis most likely given onset of pain followed by diarrhea with blood.  Hopefully home later today. Probably no need for antibiotics at discharge since likely ischemic colitis.. Never had a colonoscopy but this can be done outpatient with Dr. Benson Norway.    LOS: 3 days   Tye Savoy  01/05/2014, 11:30 AM

## 2014-01-07 LAB — STOOL CULTURE: SPECIAL REQUESTS: NORMAL

## 2014-01-20 DIAGNOSIS — K529 Noninfective gastroenteritis and colitis, unspecified: Secondary | ICD-10-CM | POA: Diagnosis not present

## 2014-01-20 DIAGNOSIS — R933 Abnormal findings on diagnostic imaging of other parts of digestive tract: Secondary | ICD-10-CM | POA: Diagnosis not present

## 2014-01-28 DIAGNOSIS — K5730 Diverticulosis of large intestine without perforation or abscess without bleeding: Secondary | ICD-10-CM | POA: Diagnosis not present

## 2014-01-28 DIAGNOSIS — Z1211 Encounter for screening for malignant neoplasm of colon: Secondary | ICD-10-CM | POA: Diagnosis not present

## 2014-01-28 DIAGNOSIS — R933 Abnormal findings on diagnostic imaging of other parts of digestive tract: Secondary | ICD-10-CM | POA: Diagnosis not present

## 2014-01-28 DIAGNOSIS — D126 Benign neoplasm of colon, unspecified: Secondary | ICD-10-CM | POA: Diagnosis not present

## 2014-04-30 DIAGNOSIS — M23331 Other meniscus derangements, other medial meniscus, right knee: Secondary | ICD-10-CM | POA: Diagnosis not present

## 2014-05-01 DIAGNOSIS — Z23 Encounter for immunization: Secondary | ICD-10-CM | POA: Diagnosis not present

## 2014-09-15 DIAGNOSIS — H18823 Corneal disorder due to contact lens, bilateral: Secondary | ICD-10-CM | POA: Diagnosis not present

## 2014-10-21 DIAGNOSIS — M25561 Pain in right knee: Secondary | ICD-10-CM | POA: Diagnosis not present

## 2014-10-21 DIAGNOSIS — M23331 Other meniscus derangements, other medial meniscus, right knee: Secondary | ICD-10-CM | POA: Diagnosis not present

## 2014-11-26 DIAGNOSIS — M25561 Pain in right knee: Secondary | ICD-10-CM | POA: Diagnosis not present

## 2014-11-27 IMAGING — CT CT ABD-PELV W/ CM
2 of 5 series · 16 of 46 positions shown, 18 images · IV contrast (APPLIED)
Comparison: None.

CLINICAL DATA: Low abdominal pain, nausea, diarrhea, bloody stool.

EXAM:
CT ABDOMEN AND PELVIS WITH CONTRAST
TECHNIQUE: Multidetector CT imaging of the abdomen and pelvis was performed
using the standard protocol following bolus administration of
intravenous contrast.
CONTRAST:  100mL OMNIPAQUE IOHEXOL 300 MG/ML  SOLN

[Series 2: abd/ pelvis 5.0 i30f 1 · axial · 0.64mm/px · z∈[+1012,+1387]mm · 13 of 85 slices shown, 15 images]
[im 5/85  soft-tissue]
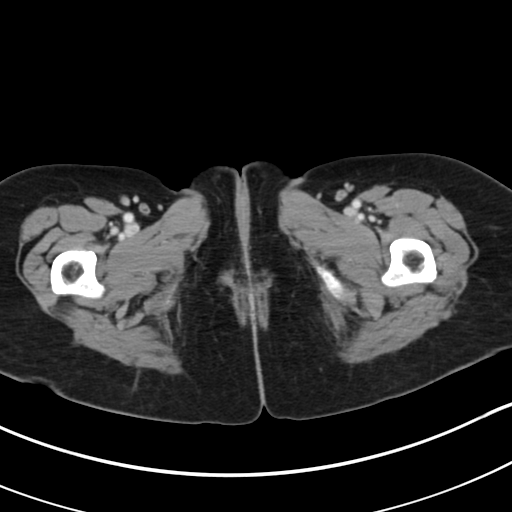
[im 5/85  bone]
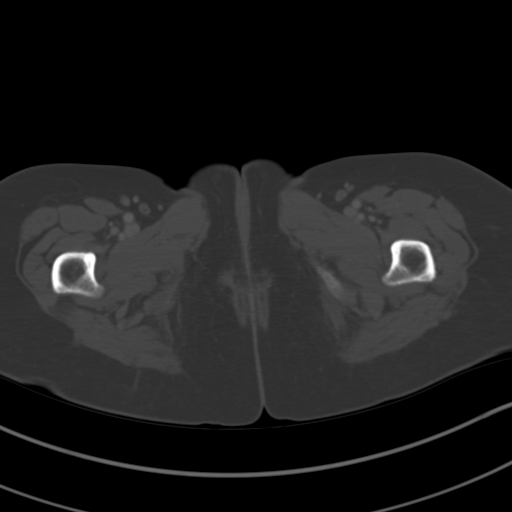
[im 14/85  soft-tissue]
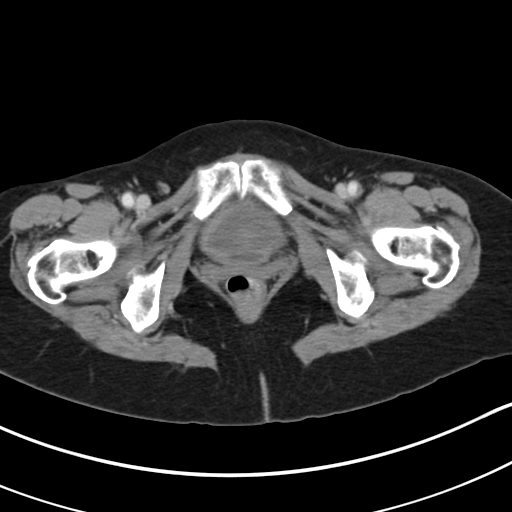
[im 18/85  soft-tissue]
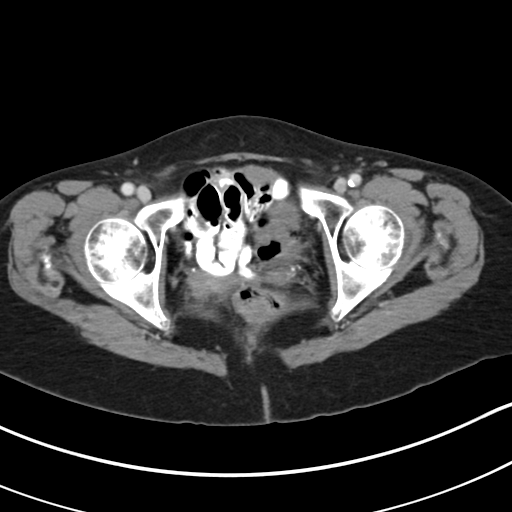
[im 23/85  soft-tissue]
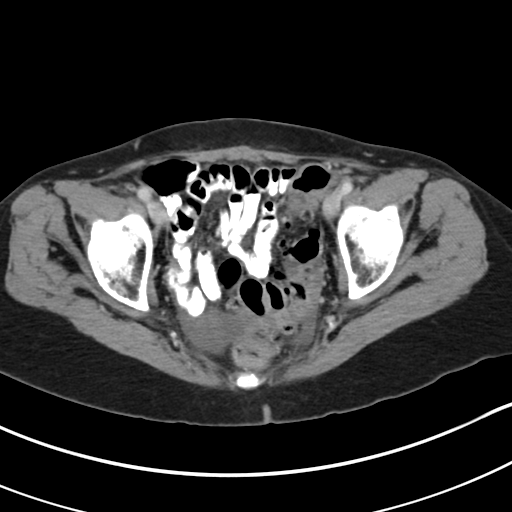
[im 31/85  soft-tissue]
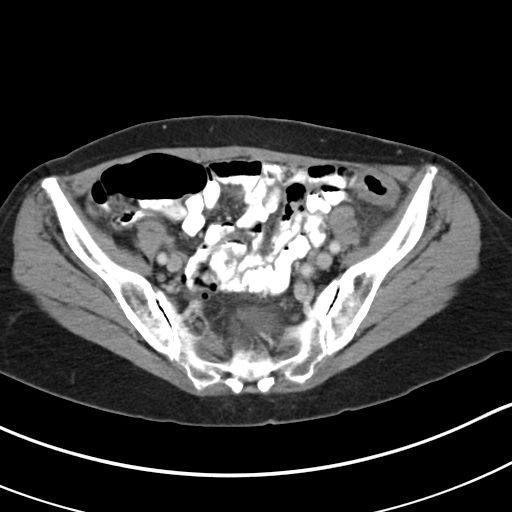
[im 36/85  soft-tissue]
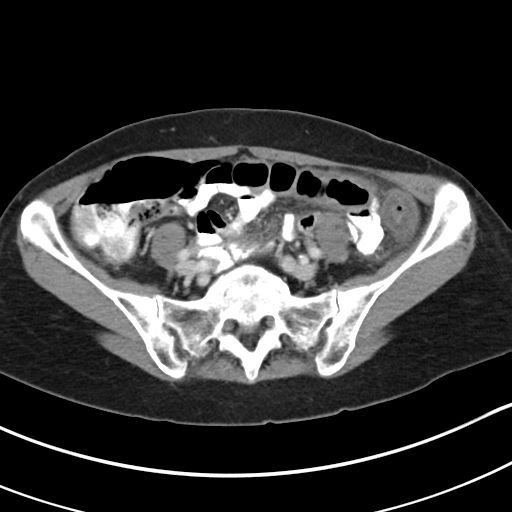
[im 45/85  soft-tissue]
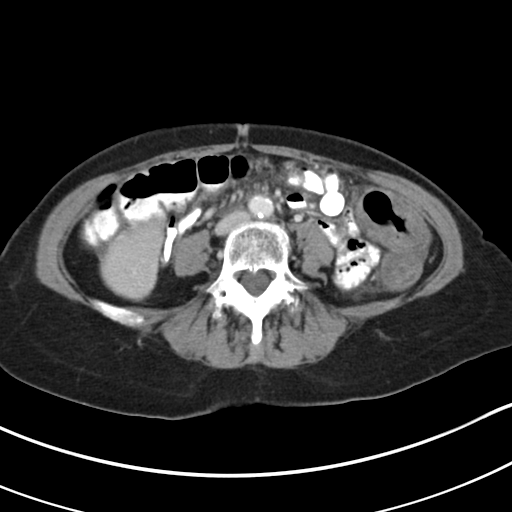
[im 49/85  soft-tissue]
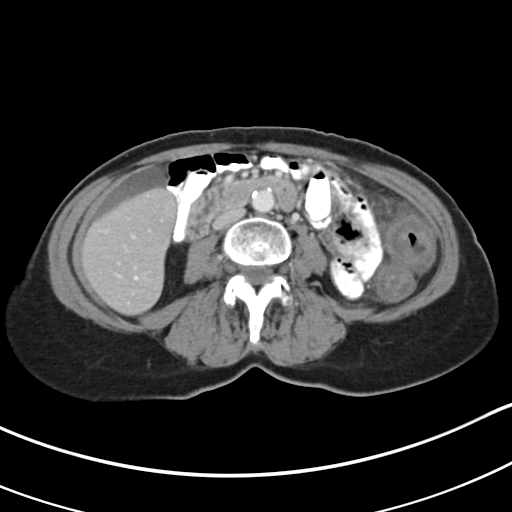
[im 54/85  soft-tissue]
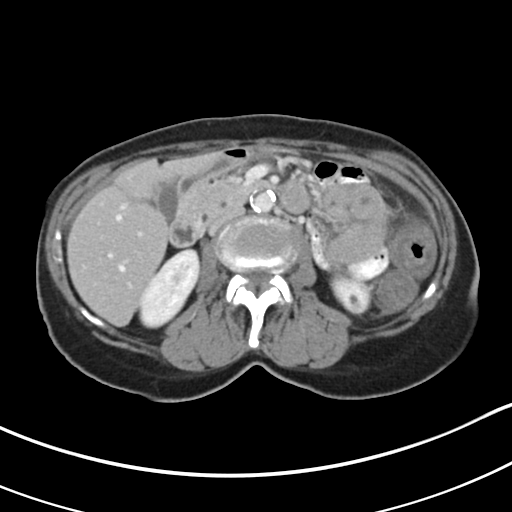
[im 54/85  bone]
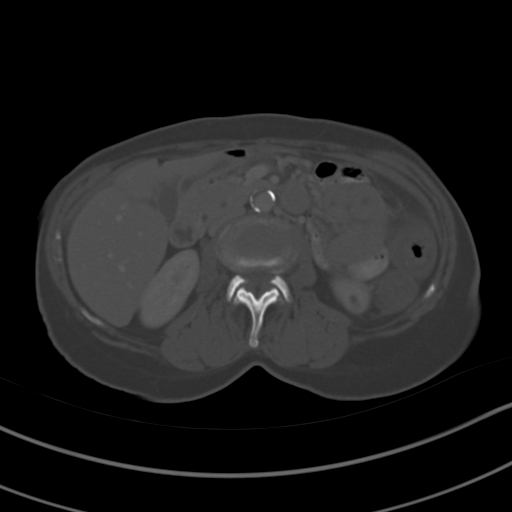
[im 62/85  soft-tissue]
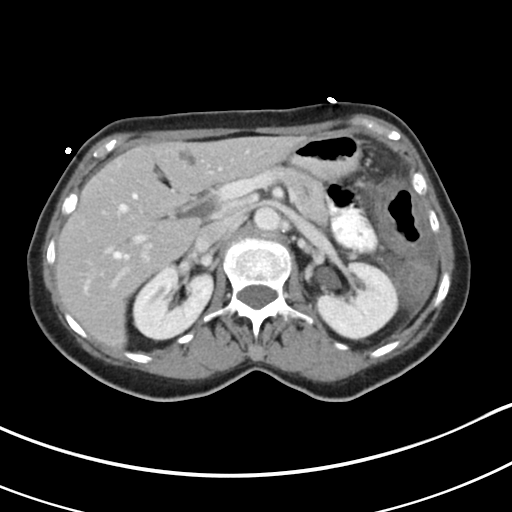
[im 67/85  soft-tissue]
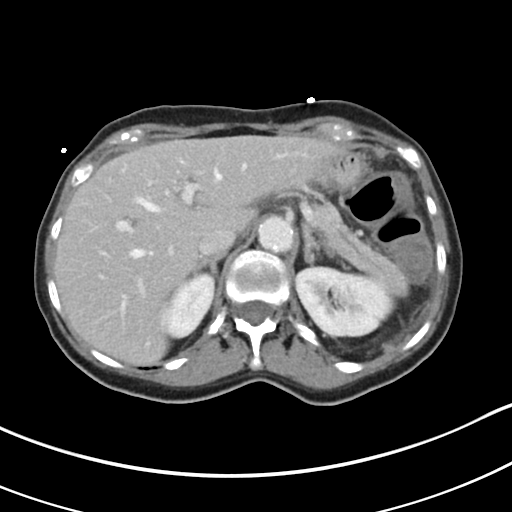
[im 71/85  soft-tissue]
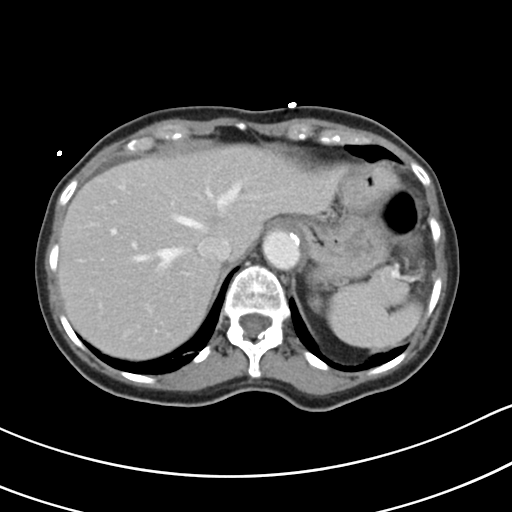
[im 80/85  soft-tissue]
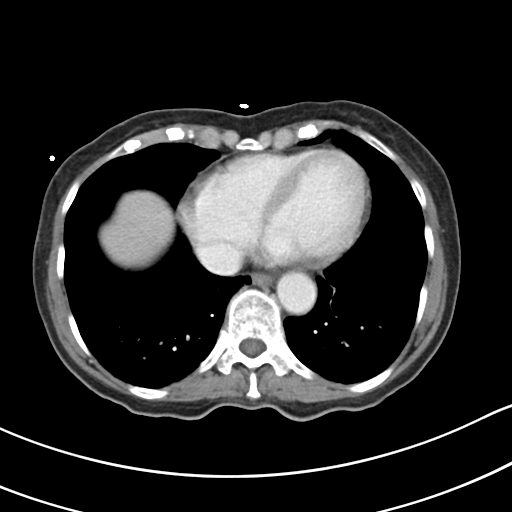

[Series 5: coronal soft tissue · coronal · 0.63mm/px · 3 of 63 slices shown]
[im 21/63  soft-tissue]
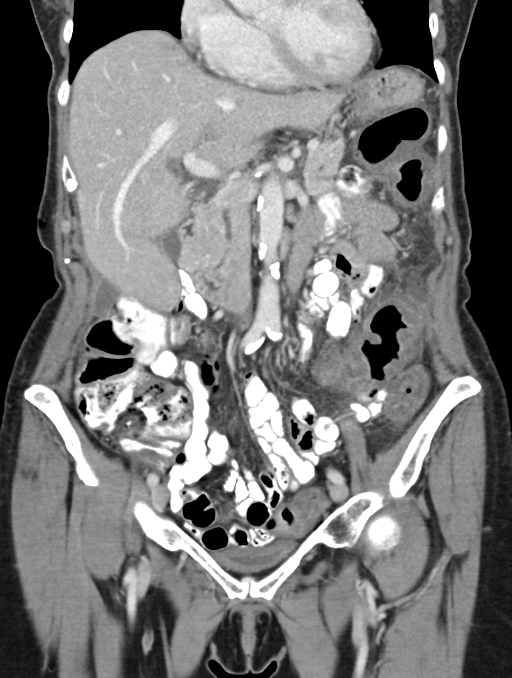
[im 28/63  soft-tissue]
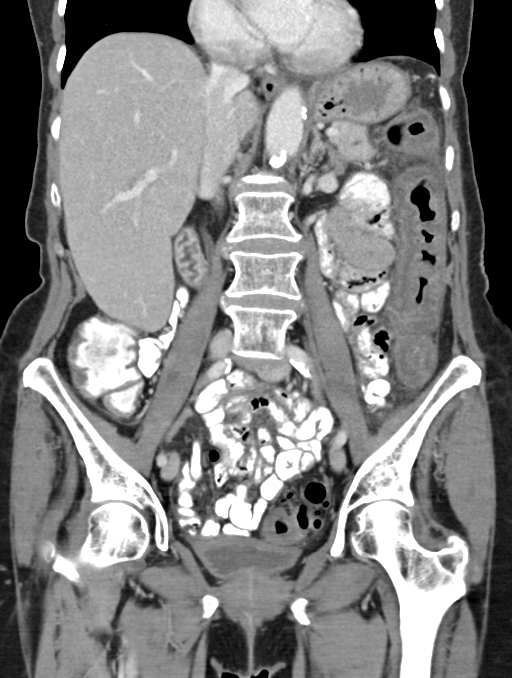
[im 35/63  soft-tissue]
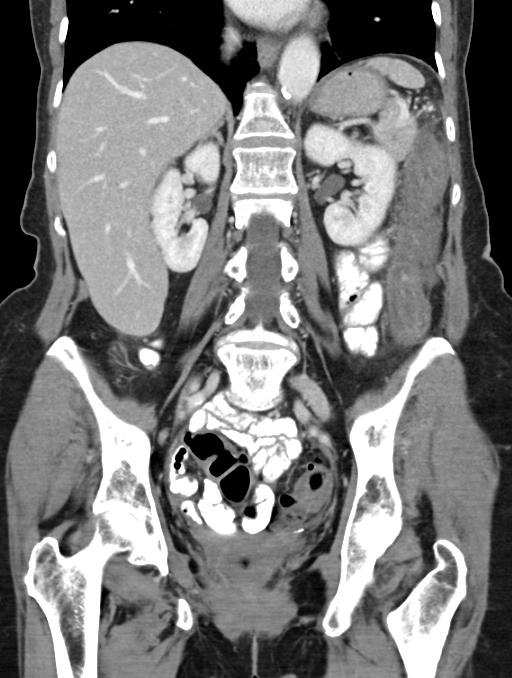

[16 of 46 positions shown; findings below may reference images not displayed]

FINDINGS: Fibrosis or atelectasis in the lung bases.

There is wall thickening and edema involving the transverse and
descending colon. There is associated pericolonic fat stranding.
Small amount of free fluid is demonstrated in the pelvis and upper
quadrant, likely reactive. Changes are consistent with nonspecific
colitis. No pneumatosis or portal venous gas is identified. Changes
are likely to represent infectious or inflammatory etiology although
inferior mesenteric artery process is not excluded. The inferior
mesenteric artery proximally appears patent.

Circumscribed low-attenuation lesion in the lateral segment left
lobe of liver measuring 11 mm diameter consistent with a small cyst.
No other focal liver lesions. The gallbladder, pancreas, spleen,
adrenal glands, kidneys, inferior vena cava, and retroperitoneal
lymph nodes are unremarkable. Calcification throughout the aorta and
iliac vessels. Stomach and small bowel are not abnormally distended
and contrast material flows through to the colon without evidence of
obstruction. No free air in the abdomen.

Pelvis: Bladder is decompressed, likely accounting for apparent wall
thickening. No evidence of diverticulitis although scattered
diverticula are present. Appendix is not identified. No significant
pelvic lymphadenopathy. Spondylolysis with mild spondylolisthesis at
L4-5. No destructive bone lesions.
IMPRESSION: Diffuse wall thickening and pericolonic infiltration involving the
transverse and descending colon consistent with nonspecific colitis,
likely to be infectious or inflammatory origin. Small amount of free
fluid in the abdomen and pelvis is likely reactive.

## 2015-03-24 DIAGNOSIS — M25561 Pain in right knee: Secondary | ICD-10-CM | POA: Diagnosis not present

## 2015-04-16 DIAGNOSIS — Z23 Encounter for immunization: Secondary | ICD-10-CM | POA: Diagnosis not present

## 2015-04-22 DIAGNOSIS — M25561 Pain in right knee: Secondary | ICD-10-CM | POA: Diagnosis not present

## 2015-04-28 ENCOUNTER — Telehealth: Payer: Self-pay | Admitting: Family Medicine

## 2015-04-28 NOTE — Telephone Encounter (Signed)
Left a message for patient to return call and schedule ov to assess fall risk, medications, mammogram, pneumonia vaccine, bmi screening, screening for high blood pressure and screening for clinical depression.

## 2015-05-13 DIAGNOSIS — Z23 Encounter for immunization: Secondary | ICD-10-CM | POA: Diagnosis not present

## 2015-08-05 DIAGNOSIS — M1711 Unilateral primary osteoarthritis, right knee: Secondary | ICD-10-CM | POA: Diagnosis not present

## 2015-08-12 DIAGNOSIS — R109 Unspecified abdominal pain: Secondary | ICD-10-CM | POA: Diagnosis not present

## 2015-08-12 DIAGNOSIS — M25561 Pain in right knee: Secondary | ICD-10-CM | POA: Diagnosis not present

## 2015-08-12 DIAGNOSIS — Z13220 Encounter for screening for lipoid disorders: Secondary | ICD-10-CM | POA: Diagnosis not present

## 2015-08-12 DIAGNOSIS — Z01818 Encounter for other preprocedural examination: Secondary | ICD-10-CM | POA: Diagnosis not present

## 2015-08-12 DIAGNOSIS — Z136 Encounter for screening for cardiovascular disorders: Secondary | ICD-10-CM | POA: Diagnosis not present

## 2015-08-25 DIAGNOSIS — M1711 Unilateral primary osteoarthritis, right knee: Secondary | ICD-10-CM | POA: Diagnosis not present

## 2015-09-10 NOTE — H&P (Signed)
  Bonnie Cowan is an 75 y.o. female.    Chief Complaint: right knee pain  HPI: Pt is a 75 y.o. female complaining of right knee pain for multiple years. Pain had continually increased since the beginning. X-rays in the clinic show end-stage arthritic changes of the right knee. Pt has tried various conservative treatments which have failed to alleviate their symptoms, including injections and therapy. Various options are discussed with the patient. Risks, benefits and expectations were discussed with the patient. Patient understand the risks, benefits and expectations and wishes to proceed with surgery.   PCP:  No PCP Per Patient  D/C Plans: Home  PMH: Past Medical History  Diagnosis Date  . Meniscus tear     right knee    PSH: Past Surgical History  Procedure Laterality Date  . Abdominal hysterectomy      Social History:  reports that she has never smoked. She does not have any smokeless tobacco history on file. Her alcohol and drug histories are not on file.  Allergies:  No Known Allergies  Medications: No current facility-administered medications for this encounter.   Current Outpatient Prescriptions  Medication Sig Dispense Refill  . acetaminophen (TYLENOL) 500 MG tablet Take 1 tablet (500 mg total) by mouth every 6 (six) hours as needed for mild pain. 30 tablet 0  . aspirin EC 81 MG tablet Take 1 tablet (81 mg total) by mouth daily.    . ciprofloxacin (CIPRO) 500 MG tablet Take 1 tablet (500 mg total) by mouth 2 (two) times daily. 8 tablet 0  . metroNIDAZOLE (FLAGYL) 500 MG tablet Take 1 tablet (500 mg total) by mouth 3 (three) times daily. 8 tablet 0  . Multiple Vitamin (MULTI VITAMIN DAILY PO) Take 1 tablet by mouth daily.    . ondansetron (ZOFRAN) 4 MG tablet Take 1 tablet (4 mg total) by mouth every 8 (eight) hours as needed for nausea or vomiting. 20 tablet 0    No results found for this or any previous visit (from the past 48 hour(s)). No results found.  ROS: Pain  with rom of the right lower extremity  Physical Exam:  Alert and oriented 75 y.o. female in no acute distress Cranial nerves 2-12 intact Cervical spine: full rom with no tenderness, nv intact distally Chest: active breath sounds bilaterally, no wheeze rhonchi or rales Heart: regular rate and rhythm, no murmur Abd: non tender non distended with active bowel sounds Hip is stable with rom  Right knee with moderate medial and lateral joint line tenderness nv intact distally No rashes or edema Antalgic gait  Assessment/Plan Assessment: right knee end stage osteoarthritis  Plan: Patient will undergo a right total knee arthroplasty by Dr. Veverly Fells at [facility]. Risks benefits and expectations were discussed with the patient. Patient understand risks, benefits and expectations and wishes to proceed.

## 2015-09-14 ENCOUNTER — Encounter (HOSPITAL_COMMUNITY): Payer: Self-pay

## 2015-09-14 ENCOUNTER — Ambulatory Visit (HOSPITAL_COMMUNITY)
Admission: RE | Admit: 2015-09-14 | Discharge: 2015-09-14 | Disposition: A | Discharge status: Home / Self Care | Payer: Medicare Other | Source: Ambulatory Visit | Attending: Orthopedic Surgery | Admitting: Orthopedic Surgery

## 2015-09-14 ENCOUNTER — Encounter (HOSPITAL_COMMUNITY)
Admission: RE | Admit: 2015-09-14 | Discharge: 2015-09-14 | Disposition: A | Discharge status: Home / Self Care | Payer: Medicare Other | Source: Ambulatory Visit | Attending: Orthopedic Surgery | Admitting: Orthopedic Surgery

## 2015-09-14 DIAGNOSIS — Z01812 Encounter for preprocedural laboratory examination: Secondary | ICD-10-CM | POA: Diagnosis not present

## 2015-09-14 DIAGNOSIS — Z0183 Encounter for blood typing: Secondary | ICD-10-CM | POA: Diagnosis not present

## 2015-09-14 DIAGNOSIS — M1711 Unilateral primary osteoarthritis, right knee: Secondary | ICD-10-CM | POA: Insufficient documentation

## 2015-09-14 DIAGNOSIS — Z01818 Encounter for other preprocedural examination: Secondary | ICD-10-CM | POA: Diagnosis not present

## 2015-09-14 DIAGNOSIS — M1990 Unspecified osteoarthritis, unspecified site: Secondary | ICD-10-CM | POA: Diagnosis not present

## 2015-09-14 DIAGNOSIS — M60861 Other myositis, right lower leg: Secondary | ICD-10-CM | POA: Diagnosis not present

## 2015-09-14 DIAGNOSIS — D72820 Lymphocytosis (symptomatic): Secondary | ICD-10-CM | POA: Diagnosis not present

## 2015-09-14 DIAGNOSIS — R05 Cough: Secondary | ICD-10-CM | POA: Diagnosis not present

## 2015-09-14 HISTORY — DX: Gastro-esophageal reflux disease without esophagitis: K21.9

## 2015-09-14 HISTORY — DX: Reserved for inherently not codable concepts without codable children: IMO0001

## 2015-09-14 HISTORY — DX: Unspecified osteoarthritis, unspecified site: M19.90

## 2015-09-14 LAB — TYPE AND SCREEN
ABO/RH(D): O NEG
Antibody Screen: NEGATIVE

## 2015-09-14 LAB — BASIC METABOLIC PANEL
ANION GAP: 10 (ref 5–15)
BUN: 17 mg/dL (ref 6–20)
CHLORIDE: 107 mmol/L (ref 101–111)
CO2: 24 mmol/L (ref 22–32)
Calcium: 9.5 mg/dL (ref 8.9–10.3)
Creatinine, Ser: 0.91 mg/dL (ref 0.44–1.00)
GFR calc Af Amer: >60 mL/min (ref >60)
GFR calc non Af Amer: >60 mL/min (ref >60)
GLUCOSE: 109 mg/dL — AB (ref 65–99)
POTASSIUM: 4.1 mmol/L (ref 3.5–5.1)
Sodium: 141 mmol/L (ref 135–145)

## 2015-09-14 LAB — SURGICAL PCR SCREEN
MRSA, PCR: NEGATIVE
STAPHYLOCOCCUS AUREUS: POSITIVE — AB

## 2015-09-14 LAB — ABO/RH: ABO/RH(D): O NEG

## 2015-09-14 NOTE — Progress Notes (Signed)
Mupirocin Ointment Rx called into [facility] in [facility] for positive PCR of Staph. Pt notified and voiced understanding.

## 2015-09-14 NOTE — Pre-Procedure Instructions (Signed)
RAYELLE STEINMANN  09/14/2015      [id number] [place] [address] Phone: [phone] [phone]    Your procedure is scheduled on   Friday  09/25/15  Report to [facility] Admitting at [facility]  Call this number if you have problems the morning of surgery:  [phone]   Remember:  Do not eat food or drink liquids after midnight.  Take these medicines the morning of surgery with A SIP OF WATER      (STOP ASPIRIN, MULTIVITAMIN / VITAMINS, IBUPROFEN/ ADVIL/ MOTRIN, GOODY POWDERS/ BC'S)   Do not wear jewelry, make-up or nail polish.  Do not wear lotions, powders, or perfumes.  You may wear deodorant.  Do not shave 48 hours prior to surgery.  Men may shave face and neck.  Do not bring valuables to the hospital.  [facility] is not responsible for any belongings or valuables.  Contacts, dentures or bridgework may not be worn into surgery.  Leave your suitcase in the car.  After surgery it may be brought to your room.  For patients admitted to the hospital, discharge time will be determined by your treatment team.  Patients discharged the day of surgery will not be allowed to drive home.   Name and phone number of your driver:   Special instructions:  [facility] - Preparing for Surgery  Before surgery, you can play an important role.  Because skin is not sterile, your skin needs to be as free of germs as possible.  You can reduce the number of germs on you skin by washing with CHG (chlorahexidine gluconate) soap before surgery.  CHG is an antiseptic cleaner which kills germs and bonds with the skin to continue killing germs even after washing.  Please DO NOT use if you have an allergy to CHG or antibacterial soaps.  If your skin becomes reddened/irritated stop using the CHG and inform your nurse when you arrive at Short Stay.  Do not shave (including legs and underarms) for at least 48 hours prior to  the first CHG shower.  You may shave your face.  Please follow these instructions carefully:   1.  Shower with CHG Soap the night before surgery and the                                morning of Surgery.  2.  If you choose to wash your hair, wash your hair first as usual with your       normal shampoo.  3.  After you shampoo, rinse your hair and body thoroughly to remove the                      Shampoo.  4.  Use CHG as you would any other liquid soap.  You can apply chg directly       to the skin and wash gently with scrungie or a clean washcloth.  5.  Apply the CHG Soap to your body ONLY FROM THE NECK DOWN.        Do not use on open wounds or open sores.  Avoid contact with your eyes,       ears, mouth and genitals (private parts).  Wash genitals (private parts)       with your normal soap.  6.  Wash thoroughly, paying special attention  to the area where your surgery        will be performed.  7.  Thoroughly rinse your body with warm water from the neck down.  8.  DO NOT shower/wash with your normal soap after using and rinsing off       the CHG Soap.  9.  Pat yourself dry with a clean towel.            10.  Wear clean pajamas.            11.  Place clean sheets on your bed the night of your first shower and do not        sleep with pets.  Day of Surgery  Do not apply any lotions/deoderants the morning of surgery.  Please wear clean clothes to the hospital/surgery center.    Please read over the following fact sheets that you were given. Pain Booklet, Coughing and Deep Breathing, Blood Transfusion Information, MRSA Information and Surgical Site Infection Prevention

## 2015-09-14 NOTE — Progress Notes (Addendum)
SPOKE WITH BOTH ANITA AND ROBBIN (NURSES AT [facility]) WHO STATED PATIENT HAD A CBC+ TSH DRAWN ON 09/14/15.  REQUESTED LABS BE FAXED.  SPOKE WITH ROBBIN ABOUT PATIENT ALSO STATING SHE HAS A COLD, NASAL CONGESTION, COUGH WITH YELLOW SPUTUM.  ROBBIN STATED SHE WOULD SEND A MESSAGE TO DR. MORROW.  PATIENT STATED SHE WAS INSTRUCTED TO STOP ASPIRIN 5 DAYS PRIOR TO SURGERY .

## 2015-09-15 DIAGNOSIS — I10 Essential (primary) hypertension: Secondary | ICD-10-CM | POA: Diagnosis not present

## 2015-09-15 DIAGNOSIS — J329 Chronic sinusitis, unspecified: Secondary | ICD-10-CM | POA: Diagnosis not present

## 2015-09-18 NOTE — Progress Notes (Signed)
REREQUESTED CBC+ TSH TO BE FAXED FROM PCP OFFICE.  [phone]

## 2015-09-24 MED ORDER — CEFAZOLIN SODIUM-DEXTROSE 2-3 GM-% IV SOLR
2.0000 g | INTRAVENOUS | Status: AC
  Administered 2015-09-25: 2 g via INTRAVENOUS
  Filled 2015-09-24: qty 50

## 2015-09-25 ENCOUNTER — Inpatient Hospital Stay (HOSPITAL_COMMUNITY): Payer: Medicare Other

## 2015-09-25 ENCOUNTER — Inpatient Hospital Stay (HOSPITAL_COMMUNITY): Payer: Medicare Other | Admitting: Anesthesiology

## 2015-09-25 ENCOUNTER — Inpatient Hospital Stay (HOSPITAL_COMMUNITY)
Admission: RE | Admit: 2015-09-25 | Discharge: 2015-09-28 | DRG: 470 | Disposition: A | Discharge status: Home Health | Payer: Medicare Other | Source: Ambulatory Visit | Attending: Orthopedic Surgery | Admitting: Orthopedic Surgery

## 2015-09-25 ENCOUNTER — Encounter (HOSPITAL_COMMUNITY)
Admission: RE | Disposition: A | Discharge status: Home Health | Payer: Self-pay | Source: Ambulatory Visit | Attending: Orthopedic Surgery

## 2015-09-25 ENCOUNTER — Encounter (HOSPITAL_COMMUNITY): Payer: Self-pay | Admitting: *Deleted

## 2015-09-25 DIAGNOSIS — Z7982 Long term (current) use of aspirin: Secondary | ICD-10-CM | POA: Diagnosis not present

## 2015-09-25 DIAGNOSIS — M25561 Pain in right knee: Secondary | ICD-10-CM | POA: Diagnosis not present

## 2015-09-25 DIAGNOSIS — Z96651 Presence of right artificial knee joint: Secondary | ICD-10-CM | POA: Diagnosis not present

## 2015-09-25 DIAGNOSIS — Z79899 Other long term (current) drug therapy: Secondary | ICD-10-CM

## 2015-09-25 DIAGNOSIS — M179 Osteoarthritis of knee, unspecified: Secondary | ICD-10-CM | POA: Diagnosis not present

## 2015-09-25 DIAGNOSIS — M1711 Unilateral primary osteoarthritis, right knee: Secondary | ICD-10-CM | POA: Diagnosis present

## 2015-09-25 DIAGNOSIS — Z471 Aftercare following joint replacement surgery: Secondary | ICD-10-CM | POA: Diagnosis not present

## 2015-09-25 DIAGNOSIS — Z96659 Presence of unspecified artificial knee joint: Secondary | ICD-10-CM

## 2015-09-25 DIAGNOSIS — G8918 Other acute postprocedural pain: Secondary | ICD-10-CM | POA: Diagnosis not present

## 2015-09-25 HISTORY — PX: TOTAL KNEE ARTHROPLASTY: SHX125

## 2015-09-25 LAB — CBC
HCT: 35.7 % — ABNORMAL LOW (ref 36.0–46.0)
HEMOGLOBIN: 11.5 g/dL — AB (ref 12.0–15.0)
MCH: 29.1 pg (ref 26.0–34.0)
MCHC: 32.2 g/dL (ref 30.0–36.0)
MCV: 90.4 fL (ref 78.0–100.0)
PLATELETS: 379 10*3/uL (ref 150–400)
RBC: 3.95 MIL/uL (ref 3.87–5.11)
RDW: 13.7 % (ref 11.5–15.5)
WBC: 13.3 10*3/uL — ABNORMAL HIGH (ref 4.0–10.5)

## 2015-09-25 LAB — CREATININE, SERUM
CREATININE: 0.72 mg/dL (ref 0.44–1.00)
GFR calc Af Amer: >60 mL/min (ref >60)

## 2015-09-25 SURGERY — ARTHROPLASTY, KNEE, TOTAL
Anesthesia: Regional | Site: Knee | Laterality: Right

## 2015-09-25 MED ORDER — FENTANYL CITRATE (PF) 100 MCG/2ML IJ SOLN
INTRAMUSCULAR | Status: DC | PRN
  Administered 2015-09-25 (×3): 25 ug via INTRAVENOUS
  Administered 2015-09-25: 50 ug via INTRAVENOUS
  Administered 2015-09-25 (×3): 25 ug via INTRAVENOUS
  Administered 2015-09-25: 50 ug via INTRAVENOUS

## 2015-09-25 MED ORDER — METOCLOPRAMIDE HCL 5 MG PO TABS: 5.0000 mg | ORAL_TABLET | Freq: Three times a day (TID) | ORAL | Status: DC | PRN

## 2015-09-25 MED ORDER — PHENOL 1.4 % MT LIQD: 1.0000 | OROMUCOSAL | Status: DC | PRN

## 2015-09-25 MED ORDER — SODIUM CHLORIDE 0.9 % IV SOLN
INTRAVENOUS | Status: DC
  Administered 2015-09-25: 18:00:00 via INTRAVENOUS

## 2015-09-25 MED ORDER — METHOCARBAMOL 1000 MG/10ML IJ SOLN: 500.0000 mg | Freq: Four times a day (QID) | INTRAVENOUS | Status: DC | PRN

## 2015-09-25 MED ORDER — TRANEXAMIC ACID 1000 MG/10ML IV SOLN
1000.0000 mg | INTRAVENOUS | Status: AC
  Administered 2015-09-25: 1000 mg via INTRAVENOUS
  Filled 2015-09-25: qty 10

## 2015-09-25 MED ORDER — 0.9 % SODIUM CHLORIDE (POUR BTL) OPTIME
TOPICAL | Status: DC | PRN
  Administered 2015-09-25: 1000 mL

## 2015-09-25 MED ORDER — OXYCODONE-ACETAMINOPHEN 5-325 MG PO TABS: 1.0000 | ORAL_TABLET | ORAL | Status: DC | PRN

## 2015-09-25 MED ORDER — ENOXAPARIN SODIUM 30 MG/0.3ML ~~LOC~~ SOLN
30.0000 mg | Freq: Two times a day (BID) | SUBCUTANEOUS | Status: DC
  Administered 2015-09-26 – 2015-09-27 (×3): 30 mg via SUBCUTANEOUS
  Filled 2015-09-25 (×3): qty 0.3

## 2015-09-25 MED ORDER — WARFARIN SODIUM 5 MG PO TABS
5.0000 mg | ORAL_TABLET | Freq: Every day | ORAL | Status: AC
  Administered 2015-09-25: 5 mg via ORAL
  Filled 2015-09-25: qty 1

## 2015-09-25 MED ORDER — WARFARIN SODIUM 5 MG PO TABS: 5.0000 mg | ORAL_TABLET | Freq: Every day | ORAL | Status: DC

## 2015-09-25 MED ORDER — BUPIVACAINE-EPINEPHRINE (PF) 0.5% -1:200000 IJ SOLN
INTRAMUSCULAR | Status: DC | PRN
  Administered 2015-09-25: 30 mL via PERINEURAL

## 2015-09-25 MED ORDER — MORPHINE SULFATE (PF) 2 MG/ML IV SOLN: 2.0000 mg | INTRAVENOUS | Status: DC | PRN

## 2015-09-25 MED ORDER — DOCUSATE SODIUM 100 MG PO CAPS
100.0000 mg | ORAL_CAPSULE | Freq: Two times a day (BID) | ORAL | Status: DC
  Administered 2015-09-25 – 2015-09-28 (×6): 100 mg via ORAL
  Filled 2015-09-25 (×6): qty 1

## 2015-09-25 MED ORDER — METHOCARBAMOL 500 MG PO TABS: 500.0000 mg | ORAL_TABLET | Freq: Three times a day (TID) | ORAL | Status: AC | PRN

## 2015-09-25 MED ORDER — ONDANSETRON HCL 4 MG/2ML IJ SOLN
INTRAMUSCULAR | Status: AC
  Filled 2015-09-25: qty 2

## 2015-09-25 MED ORDER — ACETAMINOPHEN 325 MG PO TABS
650.0000 mg | ORAL_TABLET | Freq: Four times a day (QID) | ORAL | Status: DC | PRN
  Administered 2015-09-26 – 2015-09-28 (×5): 650 mg via ORAL
  Filled 2015-09-25 (×6): qty 2

## 2015-09-25 MED ORDER — TRANEXAMIC ACID 1000 MG/10ML IV SOLN
2000.0000 mg | INTRAVENOUS | Status: AC
  Administered 2015-09-25: 2000 mg via TOPICAL
  Filled 2015-09-25: qty 20

## 2015-09-25 MED ORDER — MENTHOL 3 MG MT LOZG: 1.0000 | LOZENGE | OROMUCOSAL | Status: DC | PRN

## 2015-09-25 MED ORDER — SODIUM CHLORIDE 0.9 % IR SOLN
Status: DC | PRN
  Administered 2015-09-25: 3000 mL

## 2015-09-25 MED ORDER — ONDANSETRON HCL 4 MG/2ML IJ SOLN: 4.0000 mg | Freq: Four times a day (QID) | INTRAMUSCULAR | Status: DC | PRN

## 2015-09-25 MED ORDER — ONDANSETRON HCL 4 MG/2ML IJ SOLN
INTRAMUSCULAR | Status: DC | PRN
  Administered 2015-09-25: 4 mg via INTRAVENOUS

## 2015-09-25 MED ORDER — FENTANYL CITRATE (PF) 100 MCG/2ML IJ SOLN
25.0000 ug | INTRAMUSCULAR | Status: DC | PRN
  Administered 2015-09-25 (×3): 50 ug via INTRAVENOUS

## 2015-09-25 MED ORDER — POLYETHYLENE GLYCOL 3350 17 G PO PACK: 17.0000 g | PACK | Freq: Every day | ORAL | Status: DC | PRN

## 2015-09-25 MED ORDER — CHLORHEXIDINE GLUCONATE 4 % EX LIQD: 60.0000 mL | Freq: Once | CUTANEOUS | Status: DC

## 2015-09-25 MED ORDER — FERROUS SULFATE 325 (65 FE) MG PO TABS
325.0000 mg | ORAL_TABLET | Freq: Three times a day (TID) | ORAL | Status: DC
  Administered 2015-09-25 – 2015-09-28 (×9): 325 mg via ORAL
  Filled 2015-09-25 (×9): qty 1

## 2015-09-25 MED ORDER — PROPOFOL 10 MG/ML IV BOLUS
INTRAVENOUS | Status: DC | PRN
  Administered 2015-09-25: 200 mg via INTRAVENOUS

## 2015-09-25 MED ORDER — OXYCODONE HCL 5 MG/5ML PO SOLN: 5.0000 mg | Freq: Once | ORAL | Status: DC | PRN

## 2015-09-25 MED ORDER — BISACODYL 10 MG RE SUPP: 10.0000 mg | Freq: Every day | RECTAL | Status: DC | PRN

## 2015-09-25 MED ORDER — LIDOCAINE HCL (CARDIAC) 20 MG/ML IV SOLN
INTRAVENOUS | Status: AC
  Filled 2015-09-25: qty 5

## 2015-09-25 MED ORDER — MIDAZOLAM HCL 2 MG/2ML IJ SOLN
INTRAMUSCULAR | Status: AC
  Administered 2015-09-25: 1 mg
  Filled 2015-09-25: qty 2

## 2015-09-25 MED ORDER — FENTANYL CITRATE (PF) 250 MCG/5ML IJ SOLN
INTRAMUSCULAR | Status: AC
  Filled 2015-09-25: qty 5

## 2015-09-25 MED ORDER — OXYCODONE HCL 5 MG PO TABS
5.0000 mg | ORAL_TABLET | Freq: Once | ORAL | Status: DC | PRN
Start: 2015-09-25 — End: 2015-09-25

## 2015-09-25 MED ORDER — METOCLOPRAMIDE HCL 5 MG/ML IJ SOLN: 5.0000 mg | Freq: Three times a day (TID) | INTRAMUSCULAR | Status: DC | PRN

## 2015-09-25 MED ORDER — PROPOFOL 10 MG/ML IV BOLUS
INTRAVENOUS | Status: AC
  Filled 2015-09-25: qty 20

## 2015-09-25 MED ORDER — FENTANYL CITRATE (PF) 100 MCG/2ML IJ SOLN
INTRAMUSCULAR | Status: AC
  Administered 2015-09-25: 50 ug via INTRAVENOUS
  Filled 2015-09-25: qty 2

## 2015-09-25 MED ORDER — ONDANSETRON HCL 4 MG/2ML IJ SOLN
4.0000 mg | Freq: Four times a day (QID) | INTRAMUSCULAR | Status: DC | PRN
  Administered 2015-09-26: 4 mg via INTRAVENOUS
  Filled 2015-09-25: qty 2

## 2015-09-25 MED ORDER — WARFARIN - PHARMACIST DOSING INPATIENT
Freq: Every day | Status: DC
  Administered 2015-09-26: 19:00:00

## 2015-09-25 MED ORDER — CEFAZOLIN SODIUM-DEXTROSE 2-3 GM-% IV SOLR
2.0000 g | Freq: Four times a day (QID) | INTRAVENOUS | Status: AC
  Administered 2015-09-25 – 2015-09-26 (×2): 2 g via INTRAVENOUS
  Filled 2015-09-25 (×3): qty 50

## 2015-09-25 MED ORDER — ACETAMINOPHEN 650 MG RE SUPP: 650.0000 mg | Freq: Four times a day (QID) | RECTAL | Status: DC | PRN

## 2015-09-25 MED ORDER — OXYCODONE HCL 5 MG PO TABS
5.0000 mg | ORAL_TABLET | ORAL | Status: DC | PRN
  Administered 2015-09-25 (×2): 10 mg via ORAL
  Administered 2015-09-25: 5 mg via ORAL
  Administered 2015-09-26 (×2): 10 mg via ORAL
  Administered 2015-09-26: 5 mg via ORAL
  Administered 2015-09-26 – 2015-09-28 (×13): 10 mg via ORAL
  Filled 2015-09-25 (×6): qty 2
  Filled 2015-09-25: qty 1
  Filled 2015-09-25 (×11): qty 2

## 2015-09-25 MED ORDER — LIDOCAINE HCL (CARDIAC) 20 MG/ML IV SOLN
INTRAVENOUS | Status: DC | PRN
  Administered 2015-09-25: 60 mg via INTRAVENOUS

## 2015-09-25 MED ORDER — FENTANYL CITRATE (PF) 100 MCG/2ML IJ SOLN
INTRAMUSCULAR | Status: AC
  Administered 2015-09-25: 50 ug
  Filled 2015-09-25: qty 2

## 2015-09-25 MED ORDER — METHOCARBAMOL 500 MG PO TABS
500.0000 mg | ORAL_TABLET | Freq: Four times a day (QID) | ORAL | Status: DC | PRN
  Administered 2015-09-25 – 2015-09-28 (×6): 500 mg via ORAL
  Filled 2015-09-25 (×6): qty 1

## 2015-09-25 MED ORDER — SODIUM CHLORIDE 0.9 % IV SOLN
10.0000 mg | INTRAVENOUS | Status: DC | PRN
  Administered 2015-09-25: 50 ug/min via INTRAVENOUS

## 2015-09-25 MED ORDER — OXYCODONE HCL 5 MG PO TABS
ORAL_TABLET | ORAL | Status: AC
  Filled 2015-09-25: qty 2

## 2015-09-25 MED ORDER — FENTANYL CITRATE (PF) 100 MCG/2ML IJ SOLN
INTRAMUSCULAR | Status: AC
Start: 2015-09-25 — End: 2015-09-26
  Filled 2015-09-25: qty 2

## 2015-09-25 MED ORDER — LACTATED RINGERS IV SOLN
INTRAVENOUS | Status: DC
  Administered 2015-09-25 (×2): via INTRAVENOUS

## 2015-09-25 MED ORDER — ONDANSETRON HCL 4 MG PO TABS
4.0000 mg | ORAL_TABLET | Freq: Four times a day (QID) | ORAL | Status: DC | PRN
Start: 2015-09-25 — End: 2015-09-28

## 2015-09-25 SURGICAL SUPPLY — 59 items
BANDAGE ESMARK 6X9 LF (GAUZE/BANDAGES/DRESSINGS) ×1 IMPLANT
BLADE SAG 18X100X1.27 (BLADE) ×3 IMPLANT
BLADE SAW SGTL 13.0X1.19X90.0M (BLADE) ×3 IMPLANT
BNDG CMPR 9X6 STRL LF SNTH (GAUZE/BANDAGES/DRESSINGS) ×1
BNDG CMPR MED 10X6 ELC LF (GAUZE/BANDAGES/DRESSINGS) ×1
BNDG ELASTIC 6X10 VLCR STRL LF (GAUZE/BANDAGES/DRESSINGS) ×3 IMPLANT
BNDG ESMARK 6X9 LF (GAUZE/BANDAGES/DRESSINGS) ×3
BNDG GAUZE ELAST 4 BULKY (GAUZE/BANDAGES/DRESSINGS) ×6 IMPLANT
BOWL SMART MIX CTS (DISPOSABLE) ×3 IMPLANT
CAPT KNEE TOTAL 3 ATTUNE ×2 IMPLANT
CEMENT HV SMART SET (Cement) ×6 IMPLANT
CLOSURE WOUND 1/2 X4 (GAUZE/BANDAGES/DRESSINGS) ×2
COVER SURGICAL LIGHT HANDLE (MISCELLANEOUS) ×3 IMPLANT
CUFF TOURNIQUET SINGLE 34IN LL (TOURNIQUET CUFF) ×2 IMPLANT
CUFF TOURNIQUET SINGLE 44IN (TOURNIQUET CUFF) IMPLANT
DRAPE EXTREMITY T 121X128X90 (DRAPE) ×3 IMPLANT
DRAPE IMP U-DRAPE 54X76 (DRAPES) ×3 IMPLANT
DRAPE PROXIMA HALF (DRAPES) ×3 IMPLANT
DRAPE U-SHAPE 47X51 STRL (DRAPES) ×3 IMPLANT
DRSG ADAPTIC 3X8 NADH LF (GAUZE/BANDAGES/DRESSINGS) ×3 IMPLANT
DRSG PAD ABDOMINAL 8X10 ST (GAUZE/BANDAGES/DRESSINGS) ×4 IMPLANT
DURAPREP 26ML APPLICATOR (WOUND CARE) ×3 IMPLANT
ELECT CAUTERY BLADE 6.4 (BLADE) ×3 IMPLANT
ELECT REM PT RETURN 9FT ADLT (ELECTROSURGICAL) ×3
ELECTRODE REM PT RTRN 9FT ADLT (ELECTROSURGICAL) ×1 IMPLANT
GAUZE SPONGE 4X4 12PLY STRL (GAUZE/BANDAGES/DRESSINGS) ×3 IMPLANT
GLOVE BIOGEL PI ORTHO PRO 7.5 (GLOVE) ×2
GLOVE BIOGEL PI ORTHO PRO SZ8 (GLOVE) ×2
GLOVE ORTHO TXT STRL SZ7.5 (GLOVE) ×3 IMPLANT
GLOVE PI ORTHO PRO STRL 7.5 (GLOVE) ×1 IMPLANT
GLOVE PI ORTHO PRO STRL SZ8 (GLOVE) ×1 IMPLANT
GLOVE SURG ORTHO 8.5 STRL (GLOVE) ×3 IMPLANT
GOWN STRL REUS W/ TWL XL LVL3 (GOWN DISPOSABLE) ×3 IMPLANT
GOWN STRL REUS W/TWL XL LVL3 (GOWN DISPOSABLE) ×9
HANDPIECE INTERPULSE COAX TIP (DISPOSABLE) ×3
IMMOBILIZER KNEE 22 UNIV (SOFTGOODS) ×2 IMPLANT
KIT BASIN OR (CUSTOM PROCEDURE TRAY) ×3 IMPLANT
KIT MANIFOLD (MISCELLANEOUS) ×3 IMPLANT
KIT ROOM TURNOVER OR (KITS) ×3 IMPLANT
MANIFOLD NEPTUNE II (INSTRUMENTS) ×3 IMPLANT
NS IRRIG 1000ML POUR BTL (IV SOLUTION) ×3 IMPLANT
PACK TOTAL JOINT (CUSTOM PROCEDURE TRAY) ×3 IMPLANT
PACK UNIVERSAL I (CUSTOM PROCEDURE TRAY) ×3 IMPLANT
PAD ARMBOARD 7.5X6 YLW CONV (MISCELLANEOUS) ×6 IMPLANT
SET HNDPC FAN SPRY TIP SCT (DISPOSABLE) ×1 IMPLANT
STRIP CLOSURE SKIN 1/2X4 (GAUZE/BANDAGES/DRESSINGS) ×4 IMPLANT
SUCTION FRAZIER HANDLE 10FR (MISCELLANEOUS) ×2
SUCTION TUBE FRAZIER 10FR DISP (MISCELLANEOUS) ×1 IMPLANT
SUT MNCRL AB 3-0 PS2 18 (SUTURE) ×3 IMPLANT
SUT VIC AB 0 CT1 27 (SUTURE) ×6
SUT VIC AB 0 CT1 27XBRD ANBCTR (SUTURE) ×2 IMPLANT
SUT VIC AB 1 CT1 27 (SUTURE) ×9
SUT VIC AB 1 CT1 27XBRD ANBCTR (SUTURE) ×3 IMPLANT
SUT VIC AB 2-0 CT1 27 (SUTURE) ×6
SUT VIC AB 2-0 CT1 TAPERPNT 27 (SUTURE) ×2 IMPLANT
TOWEL OR 17X24 6PK STRL BLUE (TOWEL DISPOSABLE) ×3 IMPLANT
TOWEL OR 17X26 10 PK STRL BLUE (TOWEL DISPOSABLE) ×3 IMPLANT
TRAY FOLEY CATH 16FRSI W/METER (SET/KITS/TRAYS/PACK) ×2 IMPLANT
WATER STERILE IRR 1000ML POUR (IV SOLUTION) ×2 IMPLANT

## 2015-09-25 NOTE — Brief Op Note (Signed)
09/25/2015  3:33 PM  PATIENT:  Bonnie Cowan  75 y.o. female  PRE-OPERATIVE DIAGNOSIS:  right knee medial end stage osteoarthritis  POST-OPERATIVE DIAGNOSIS:  right knee medial end stage osteoarthritis  PROCEDURE:  Procedure(s): RIGHT TOTAL KNEE ARTHROPLASTY (Right)  Depuy Attune  SURGEON:  Surgeon(s) and Role:    * Netta Cedars, MD - Primary  PHYSICIAN ASSISTANT:   ASSISTANTS: Ventura Bruns, PA-C, Kern Reap, PA-S   ANESTHESIA:   regional and general  EBL:  Total I/O In: 1000 [I.V.:1000] Out: 450 [Urine:450]  BLOOD ADMINISTERED:none  DRAINS: none   LOCAL MEDICATIONS USED:  NONE  SPECIMEN:  No Specimen  DISPOSITION OF SPECIMEN:  N/A  COUNTS:  YES  TOURNIQUET:  * Missing tourniquet times found for documented tourniquets in log:  [id number] *  DICTATION: .Other Dictation: Dictation Number [id number]  PLAN OF CARE: Admit to inpatient   PATIENT DISPOSITION:  PACU - hemodynamically stable.   Delay start of Pharmacological VTE agent (>24hrs) due to surgical blood loss or risk of bleeding: no

## 2015-09-25 NOTE — Discharge Instructions (Signed)
Ice and elevate the knee when you can.  Do NOT prop anything behind the knee.  Prop under the ankle to encourage knee extension (straightening)  Do exercises every hour for knee ROM.  You may bear full weight on the right leg  CPM 0-90 degrees at home for 6 hours per day in two hour increments.  May increase the flexion as tolerated.  Sleep in the knee immobilizer and may use for walking if therapy directs.  Keep the incision clean and dry and covered for one week, then ok to get wet in the shower.  Call the office for follow up appointment in two weeks  [phone]  Information on my medicine - Coumadin   (Warfarin)  This medication education was reviewed with me or my healthcare representative as part of my discharge preparation.  The pharmacist that spoke with me during my hospital stay was:  Lavenia Atlas, [facility]  Why was Coumadin prescribed for you? Coumadin was prescribed for you because you have a blood clot or a medical condition that can cause an increased risk of forming blood clots. Blood clots can cause serious health problems by blocking the flow of blood to the heart, lung, or brain. Coumadin can prevent harmful blood clots from forming. As a reminder your indication for Coumadin is:   Blood Clot Prevention After Orthopedic Surgery  What test will check on my response to Coumadin? While on Coumadin (warfarin) you will need to have an INR test regularly to ensure that your dose is keeping you in the desired range. The INR (international normalized ratio) number is calculated from the result of the laboratory test called prothrombin time (PT).  If an INR APPOINTMENT HAS NOT ALREADY BEEN MADE FOR YOU please schedule an appointment to have this lab work done by your health care provider within 7 days. Your INR goal is usually a number between:  2 to 3 or your provider may give you a more narrow range like 2-2.5.  Ask your health care provider during an office visit what your  goal INR is.  What  do you need to  know  About  COUMADIN? Take Coumadin (warfarin) exactly as prescribed by your healthcare provider about the same time each day.  DO NOT stop taking without talking to the doctor who prescribed the medication.  Stopping without other blood clot prevention medication to take the place of Coumadin may increase your risk of developing a new clot or stroke.  Get refills before you run out.  What do you do if you miss a dose? If you miss a dose, take it as soon as you remember on the same day then continue your regularly scheduled regimen the next day.  Do not take two doses of Coumadin at the same time.  Important Safety Information A possible side effect of Coumadin (Warfarin) is an increased risk of bleeding. You should call your healthcare provider right away if you experience any of the following: ? Bleeding from an injury or your nose that does not stop. ? Unusual colored urine (red or dark brown) or unusual colored stools (red or black). ? Unusual bruising for unknown reasons. ? A serious fall or if you hit your head (even if there is no bleeding).  Some foods or medicines interact with Coumadin (warfarin) and might alter your response to warfarin. To help avoid this: ? Eat a balanced diet, maintaining a consistent amount of Vitamin K. ? Notify your provider about major diet  changes you plan to make. ? Avoid alcohol or limit your intake to 1 drink for women and 2 drinks for men per day. (1 drink is 5 oz. wine, 12 oz. beer, or 1.5 oz. liquor.)  Make sure that ANY health care provider who prescribes medication for you knows that you are taking Coumadin (warfarin).  Also make sure the healthcare provider who is monitoring your Coumadin knows when you have started a new medication including herbals and non-prescription products.  Coumadin (Warfarin)  Major Drug Interactions  Increased Warfarin Effect Decreased Warfarin Effect  Alcohol (large  quantities) Antibiotics (esp. Septra/Bactrim, Flagyl, Cipro) Amiodarone (Cordarone) Aspirin (ASA) Cimetidine (Tagamet) Megestrol (Megace) NSAIDs (ibuprofen, naproxen, etc.) Piroxicam (Feldene) Propafenone (Rythmol SR) Propranolol (Inderal) Isoniazid (INH) Posaconazole (Noxafil) Barbiturates (Phenobarbital) Carbamazepine (Tegretol) Chlordiazepoxide (Librium) Cholestyramine (Questran) Griseofulvin Oral Contraceptives Rifampin Sucralfate (Carafate) Vitamin K   Coumadin (Warfarin) Major Herbal Interactions  Increased Warfarin Effect Decreased Warfarin Effect  Garlic Ginseng Ginkgo biloba Coenzyme Q10 Green tea St. Johns wort    Coumadin (Warfarin) FOOD Interactions  Eat a consistent number of servings per week of foods HIGH in Vitamin K (1 serving =  cup)  Collards (cooked, or boiled & drained) Kale (cooked, or boiled & drained) Mustard greens (cooked, or boiled & drained) Parsley *serving size only =  cup Spinach (cooked, or boiled & drained) Swiss chard (cooked, or boiled & drained) Turnip greens (cooked, or boiled & drained)  Eat a consistent number of servings per week of foods MEDIUM-HIGH in Vitamin K (1 serving = 1 cup)  Asparagus (cooked, or boiled & drained) Broccoli (cooked, boiled & drained, or raw & chopped) Brussel sprouts (cooked, or boiled & drained) *serving size only =  cup Lettuce, raw (green leaf, endive, romaine) Spinach, raw Turnip greens, raw & chopped   These websites have more information on Coumadin (warfarin):  FailFactory.se; VeganReport.com.au;

## 2015-09-25 NOTE — Anesthesia Procedure Notes (Addendum)
Anesthesia Regional Block:  Femoral nerve block  Pre-Anesthetic Checklist: ,, timeout performed, Correct Patient, Correct Site, Correct Laterality, Correct Procedure, Correct Position, site marked, Risks and benefits discussed,  Surgical consent,  Pre-op evaluation,  At surgeon's request and post-op pain management  Laterality: Right  Prep: chloraprep       Needles:  Injection technique: Single-shot  Needle Type: Echogenic Stimulator Needle     Needle Length: 5cm 5 cm Needle Gauge: 21 and 21 G  Needle insertion depth: 4 cm   Additional Needles:  Procedures: ultrasound guided (picture in chart) Femoral nerve block Narrative:  Injection made incrementally with aspirations every 5 mL.  Performed by: Personally  Anesthesiologist: Josephine Igo  Additional Notes: Patient tolerated procedure well. Incremental 79ml injection.   Procedure Name: LMA Insertion Date/Time: 09/25/2015 1:40 PM Performed by: Rush Farmer E Pre-anesthesia Checklist: Patient identified, Emergency Drugs available, Suction available, Patient being monitored and Timeout performed Patient Re-evaluated:Patient Re-evaluated prior to inductionOxygen Delivery Method: Circle system utilized Preoxygenation: Pre-oxygenation with 100% oxygen Intubation Type: IV induction Ventilation: Mask ventilation without difficulty LMA: LMA inserted LMA Size: 4.0 Number of attempts: 1 Placement Confirmation: positive ETCO2 and breath sounds checked- equal and bilateral Tube secured with: Tape Dental Injury: Teeth and Oropharynx as per pre-operative assessment

## 2015-09-25 NOTE — Anesthesia Preprocedure Evaluation (Addendum)
Anesthesia Evaluation  Patient identified by MRN, date of birth, ID band Patient awake    Reviewed: Allergy & Precautions, NPO status , Patient's Chart, lab work & pertinent test results  Airway Mallampati: II   Neck ROM: full    Dental  (+) Teeth Intact   Pulmonary neg pulmonary ROS,    breath sounds clear to auscultation       Cardiovascular negative cardio ROS   Rhythm:regular Rate:Normal     Neuro/Psych    GI/Hepatic GERD  ,  Endo/Other    Renal/GU      Musculoskeletal   Abdominal   Peds  Hematology   Anesthesia Other Findings   Reproductive/Obstetrics                            Anesthesia Physical Anesthesia Plan  ASA: II  Anesthesia Plan: General and Regional   Post-op Pain Management:  Regional for Post-op pain   Induction: Intravenous  Airway Management Planned: Oral ETT  Additional Equipment:   Intra-op Plan:   Post-operative Plan: Extubation in OR  Informed Consent: I have reviewed the patients History and Physical, chart, labs and discussed the procedure including the risks, benefits and alternatives for the proposed anesthesia with the patient or authorized representative who has indicated his/her understanding and acceptance.     Plan Discussed with: CRNA, Anesthesiologist and Surgeon  Anesthesia Plan Comments:         Anesthesia Quick Evaluation

## 2015-09-25 NOTE — Transfer of Care (Signed)
Immediate Anesthesia Transfer of Care Note  Patient: Bonnie Cowan  Procedure(s) Performed: Procedure(s): RIGHT TOTAL KNEE ARTHROPLASTY (Right)  Patient Location: PACU  Anesthesia Type:General and Regional  Level of Consciousness: awake and alert   Airway & Oxygen Therapy: Patient Spontanous Breathing  Post-op Assessment: Report given to RN, Post -op Vital signs reviewed and stable and Patient moving all extremities X 4  Post vital signs: Reviewed and stable  Last Vitals:  Filed Vitals:   09/25/15 1152 09/25/15 1154  BP: 200/93   Temp:  36.8 C  Resp: 20     Complications: No apparent anesthesia complications

## 2015-09-25 NOTE — Progress Notes (Signed)
Orthopedic Tech Progress Note Patient Details:  Bonnie Cowan [birth year] [id number]  Ortho Devices Type of Ortho Device: Shoulder abduction pillow Ortho Device/Splint Interventions: Application   [name] 09/25/2015, 12:34 PM

## 2015-09-25 NOTE — Interval H&P Note (Signed)
History and Physical Interval Note:  09/25/2015 1:05 PM  Bonnie Cowan  has presented today for surgery, with the diagnosis of right knee medial end stage osteoarthritis  The various methods of treatment have been discussed with the patient and family. After consideration of risks, benefits and other options for treatment, the patient has consented to  Procedure(s): RIGHT TOTAL KNEE ARTHROPLASTY (Right) as a surgical intervention .  The patient's history has been reviewed, patient examined, no change in status, stable for surgery.  I have reviewed the patient's chart and labs.  Questions were answered to the patient's satisfaction.     Britanie Harshman,STEVEN R

## 2015-09-25 NOTE — Progress Notes (Signed)
Orthopedic Tech Progress Note Patient Details:  Bonnie Cowan 1941-04-30 [id number]  CPM Right Knee CPM Right Knee: On Right Knee Flexion (Degrees): 90 Right Knee Extension (Degrees): 0 Additional Comments: Trapeze bar  Ortho Devices Type of Ortho Device: Knee Immobilizer Ortho Device/Splint Interventions: Application   [name] 09/25/2015, 5:15 PM

## 2015-09-25 NOTE — Progress Notes (Signed)
ANTICOAGULATION CONSULT NOTE - Initial Consult  Pharmacy Consult for warfarin Indication: VTE prophylaxis s/p R TKA   No Known Allergies  Patient Measurements: Weight: 116 lb (52.617 kg)  Vital Signs: Temp: 98.2 F (36.8 C) (03/17 1154) BP: 171/79 mmHg (03/17 1730) Pulse Rate: 65 (03/17 1730)  Labs: No results for input(s): HGB, HCT, PLT, APTT, LABPROT, INR, HEPARINUNFRC, HEPRLOWMOCWT, CREATININE, CKTOTAL, CKMB, TROPONINI in the last 72 hours.  Estimated Creatinine Clearance: 42.2 mL/min (by C-G formula based on Cr of 0.91).   Medical History: Past Medical History  Diagnosis Date  . Meniscus tear     right knee  . Cold   . GERD (gastroesophageal reflux disease)   . Arthritis     Medications:  Prescriptions prior to admission  Medication Sig Dispense Refill Last Dose  . aspirin EC 81 MG tablet Take 1 tablet (81 mg total) by mouth daily.   Past Week at Unknown time  . BIOTIN PO Take 1 tablet by mouth daily.   Past Week at Unknown time  . CALCIUM PO Take 1 tablet by mouth daily.   Past Week at Unknown time  . Multiple Vitamin (MULTI VITAMIN DAILY PO) Take 1 tablet by mouth daily.   Past Week at Unknown time  . acetaminophen (TYLENOL) 500 MG tablet Take 1 tablet (500 mg total) by mouth every 6 (six) hours as needed for mild pain. (Patient not taking: Reported on 09/10/2015) 30 tablet 0   . ciprofloxacin (CIPRO) 500 MG tablet Take 1 tablet (500 mg total) by mouth 2 (two) times daily. (Patient not taking: Reported on 09/10/2015) 8 tablet 0   . metroNIDAZOLE (FLAGYL) 500 MG tablet Take 1 tablet (500 mg total) by mouth 3 (three) times daily. (Patient not taking: Reported on 09/10/2015) 8 tablet 0   . ondansetron (ZOFRAN) 4 MG tablet Take 1 tablet (4 mg total) by mouth every 8 (eight) hours as needed for nausea or vomiting. (Patient not taking: Reported on 09/10/2015) 20 tablet 0     Assessment: 59 YOF s/p R TKA to start Coumadin for VTE prophylaxis. Currently on Lovenox 30 mg IV Q 12  hours.   Goal of Therapy:  INR 2-3 Monitor platelets by anticoagulation protocol: Yes   Plan:  -Start Coumadin 5 mg daily -Monitor daily PT/INR  Albertina Parr, PharmD., BCPS Clinical Pharmacist Pager [phone]

## 2015-09-25 NOTE — Op Note (Signed)
Bonnie Cowan, Bonnie Cowan                    ACCOUNT NO.:  [id number]  MEDICAL RECORD NO.:  [id number]  LOCATION:  5N04C                        FACILITY:  [facility]  PHYSICIAN:  Doran Heater. Veverly Fells, M.D. DATE OF BIRTH:  12-16-1940  DATE OF PROCEDURE:  09/25/2015 DATE OF DISCHARGE:                              OPERATIVE REPORT   PREOPERATIVE DIAGNOSIS:  Right knee end-stage arthritis.  POSTOPERATIVE DIAGNOSIS:  Right knee end-stage arthritis.  PROCEDURE PERFORMED:  Right total knee replacement using DePuy Attune prosthesis.  ATTENDING SURGEON:  Doran Heater. Veverly Fells, MD.  ASSISTANT:  Abbott Pao. Dixon, PA-C, who scrubbed the entire procedure and also Kern Reap, PAS.  ANESTHESIA:  General anesthesia plus femoral block was used.  ESTIMATED BLOOD LOSS:  Minimal.  FLUID REPLACEMENT:  1500 mL crystalloid.  INSTRUMENT COUNT:  Correct.  COMPLICATIONS:  There were no complications.  ANTIBIOTICS:  Perioperative antibiotics were given.  INDICATIONS:  The patient is a 75 year old female with worsening right knee pain secondary to end-stage arthritis.  Because she has had progressive pain despite conservative management, presents for operative total knee arthroplasty to remove pain and restore function to the knee. Informed consent obtained.  DESCRIPTION OF PROCEDURE:  After an adequate level of anesthesia achieved, the patient was positioned supine on the operating room table. The right leg was correctly identified.  Nonsterile tourniquet placed proximal thigh.  Right leg was sterilely prepped and draped in usual manner.  Time-out was called.  We then elevated the leg and exsanguinated using Esmarch bandage.  Tourniquet was elevated to 300 mmHg.  We then went ahead and placed the knee in flexion.  A longitudinal midline incision was created with the knee in flexion. Dissection down to the patella retinaculum, we performed a median parapatellar arthrotomy with a fresh #10 blade scalpel.  We everted  the patella and divided the lateral patellofemoral ligaments.  We entered the distal femur using a step-cut drill.  We then introduced the Attune distal femoral resection guide, set on 3 degrees right, 9 mm resection, we resected the distal femur.  We then went and resected ACL, PCL, and meniscal tissue.  We subluxed tibia anteriorly and then cut the tibia 90 degrees perpendicular along axis of the tibia with 3 degrees posterior slope to this posterior cruciate substituting prosthesis.  We then went and checked our extension gap which looked really good for a size 6.  At that point, we went ahead and removed our pins on her tibia.  We then went ahead and completed our distal femoral preparation.  We sized the femur which was a size 4 anterior down and then placed our pins and then placed our 4-in-1 block.  We checked our flexion gap which was also symmetric at 6.  We then went ahead and resected anterior-posterior and chamfer cuts.  We then checked our flexion and extension gaps which were symmetric.  We then went ahead and completed our tibial preparation with the modular drill and keel punch and then also our femoral preparation with the box cut.  Once we had this cuts completed, we checked for any posterior bone on the posterior aspect of the femur removed that and  then impacted our trial on the tibial side which is a 4 tray, marked our center point on the bone and then we went ahead and selected our 4 right femur and impacted that in position.  We did not have to go with the narrow, the size fit perfectly.  We then reduced the knee with the +6 trial and went ahead with the size 6 trial, we reduced the knee and ranged the knee.  We had full extension and nice flexion stability.  We then went ahead to resurface the patella which we went from 23 mm thickness down to about 14 mm of thickness, and we cut it for the 32 patellar button.  We drilled the lug holes for the patellar button.   We also drilled the lug holes on the femur for the Attune femoral component.  We placed a 32 patellar button in place and reduced the knee.  We ranged the knee and had excellent stability and tracking with no-touch technique.  We removed all trial components, pulse irrigated the knee, dried the bone and then cemented the components into place using DePuy high viscosity cement.  We placed the knee in extension with a size 6 tray.  We then went ahead and trialed with the size 7.  Once the cement was allowed to harden, we removed excess cement coarse curved osteotome.  We felt like we could get the 8 in place which would give [place] a little bit better flexion and stability, and also there was seen to be tiny bit of hyperextension with the 7 in place.  We did select the size 8 real tibial component, subluxed the tibia, checked for any cement and made sure we had none.  Irrigated the knee fully and then placed the 8 tray in position.  We then reduced the knee and had nice little snap medially.  We have to full extension, nice flexion stability.  We irrigated the knee with copious amounts of pulsatile normal saline irrigation and then we went ahead, the patient had been given some IV TXA by the anesthesiologist which should be ready to be disseminated into the leg once the tourniquet was dropped, and then we went ahead and poured in some topical TXA at this point, as we were closing our median parapatellar arthrotomy with interrupted #1 Vicryl suture.  We completed the closure, ranged the knee, everything stayed nice and stable.  It had a very solid repair.  We sucked out the remaining TXA just before putting our last couple stitches in.  At this point, we closed the subcu layer closure with 0 and 2-0 Vicryl and then 4-0 running Monocryl for skin.  Steri-Strips applied followed by sterile dressing.  The patient tolerated the procedure well.     Doran Heater. Veverly Fells, M.D.     SRN/MEDQ  D:   09/25/2015  T:  09/25/2015  Job:  [id number]

## 2015-09-25 NOTE — Progress Notes (Signed)
Orthopedic Tech Progress Note Patient Details:  Bonnie Cowan 1941-05-16 [id number]  [name], female   DOB: Sep 08, 1940, 75 y.o.   MRN: [id number]   Bonnie Cowan 09/25/2015, 4:48 PMOR gave me the wrong patient sticker.

## 2015-09-26 LAB — CBC
HEMATOCRIT: 32.8 % — AB (ref 36.0–46.0)
HEMOGLOBIN: 10.4 g/dL — AB (ref 12.0–15.0)
MCH: 28.3 pg (ref 26.0–34.0)
MCHC: 31.7 g/dL (ref 30.0–36.0)
MCV: 89.4 fL (ref 78.0–100.0)
Platelets: 366 10*3/uL (ref 150–400)
RBC: 3.67 MIL/uL — ABNORMAL LOW (ref 3.87–5.11)
RDW: 13.8 % (ref 11.5–15.5)
WBC: 9.5 10*3/uL (ref 4.0–10.5)

## 2015-09-26 LAB — BASIC METABOLIC PANEL
ANION GAP: 8 (ref 5–15)
BUN: 8 mg/dL (ref 6–20)
CHLORIDE: 104 mmol/L (ref 101–111)
CO2: 23 mmol/L (ref 22–32)
Calcium: 9.1 mg/dL (ref 8.9–10.3)
Creatinine, Ser: 0.69 mg/dL (ref 0.44–1.00)
GFR calc non Af Amer: >60 mL/min (ref >60)
Glucose, Bld: 132 mg/dL — ABNORMAL HIGH (ref 65–99)
Potassium: 4 mmol/L (ref 3.5–5.1)
Sodium: 135 mmol/L (ref 135–145)

## 2015-09-26 LAB — PROTIME-INR
INR: 1.19 (ref 0.00–1.49)
PROTHROMBIN TIME: 15.3 s — AB (ref 11.6–15.2)

## 2015-09-26 MED ORDER — WARFARIN SODIUM 7.5 MG PO TABS
7.5000 mg | ORAL_TABLET | Freq: Once | ORAL | Status: AC
  Administered 2015-09-26: 7.5 mg via ORAL
  Filled 2015-09-26: qty 1

## 2015-09-26 MED ORDER — CALCIUM CARBONATE ANTACID 500 MG PO CHEW
1.0000 | CHEWABLE_TABLET | Freq: Three times a day (TID) | ORAL | Status: DC | PRN
  Administered 2015-09-27 – 2015-09-28 (×2): 200 mg via ORAL
  Filled 2015-09-26 (×3): qty 1

## 2015-09-26 NOTE — Care Management Note (Signed)
Case Management Note  Patient Details  Name: Bonnie Cowan MRN: [id number] Date of Birth: Nov 18, 1940  Subjective/Objective:   75 yr old female s/p right total knee arthroplasty.                 Action/Plan: Case manager spoke with patient, husband and daughter Drue Dun concerning home health and DME needs at discharge. Patient was preoperatively setup with [facility], no changes with that. [name] has a 3in1, cm has ordered a rolling walker. CPM will be delivered to the home at discharge. Patient will have family support at discharge. No further case manager needs.     Expected Discharge Date:    09/27/15              Expected Discharge Plan:  [place]  In-House Referral:     Discharge planning Services  CM Consult  Post Acute Care Choice:  Durable Medical Equipment, Home Health Choice offered to:  Patient, Adult Children  DME Arranged:  3-N-1, Walker rolling DME Agency:  [place]:  PT [place]:  [place]  Status of Service:  Completed, signed off  Medicare Important Message Given:    Date Medicare IM Given:    Medicare IM give by:    Date Additional Medicare IM Given:    Additional Medicare Important Message give by:     If discussed at [place] of Stay Meetings, dates discussed:    Additional Comments:  Ninfa Meeker, RN 09/26/2015, 11:22 AM

## 2015-09-26 NOTE — Progress Notes (Signed)
CSW received consult for SNF placement, but reviewed patient's chart - plan is for patient to return home upon discharge.   No further CSW needs identified - CSW signing off.   Raynaldo Opitz, LCSW Clinical Social Worker cell #: [phone] (weekend coverage)

## 2015-09-26 NOTE — Progress Notes (Signed)
Bonnie Cowan  MRN: [id number] DOB/Age: [birth year] 76 y.o. Physician: Ander Slade, Cowan.D. 1 Day Post-Op Procedure(s) (LRB): RIGHT TOTAL KNEE ARTHROPLASTY (Right)  Subjective: Sitting at bedside, resting comfortably, pain well controlled, god appetite Vital Signs Temp:  [98 F (36.7 C)-98.6 F (37 C)] 98 F (36.7 C) (03/18 0430) Pulse Rate:  [64-90] 65 (03/18 0430) Resp:  [15-20] 17 (03/18 0430) BP: (131-200)/(60-98) 143/60 mmHg (03/18 0430) SpO2:  [96 %-100 %] 98 % (03/18 1031) Weight:  [52.617 kg (116 lb)] 52.617 kg (116 lb) (03/17 1150)  Lab Results  Recent Labs  09/25/15 1907 09/26/15 0721  WBC 13.3* 9.5  HGB 11.5* 10.4*  HCT 35.7* 32.8*  PLT 379 366   BMET  Recent Labs  09/25/15 1907 09/26/15 0721  NA  --  135  K  --  4.0  CL  --  104  CO2  --  23  GLUCOSE  --  132*  BUN  --  8  CREATININE 0.72 0.69  CALCIUM  --  9.1   INR  Date Value Ref Range Status  09/26/2015 1.19 0.00 - 1.49 Final     Exam  N/v intact RLE, good ankle motion  Plan Advance per TKA protocol  Bonnie Cowan Bonnie Cowan 09/26/2015, 10:53 AM    Contact # [phone]

## 2015-09-26 NOTE — Evaluation (Signed)
Physical Therapy Evaluation Patient Details Name: Bonnie Cowan MRN: [id number] DOB: 1940/09/02 Today's Date: 09/26/2015   History of Present Illness  Pt admitted for elective R TKR  Clinical Impression  Patient presents with functional limitations in mobility and transfers due to pain, decreased ROM/strength.  Patient will benefit from continued PT to increase their independence and safety with mobility to allow discharge to home with husband.       Follow Up Recommendations Home health PT    Equipment Recommendations  Rolling walker with 5" wheels    Recommendations for Other Services       Precautions / Restrictions Precautions Precautions: Knee Required Braces or Orthoses: Knee Immobilizer - Right Knee Immobilizer - Right: On except when in CPM Restrictions RLE Weight Bearing: Weight bearing as tolerated      Mobility  Bed Mobility Overal bed mobility: Needs Assistance Bed Mobility: Supine to Sit     Supine to sit: Min assist     General bed mobility comments: verbal cues for sequencing/technique; assist for RLE  Transfers Overall transfer level: Needs assistance Equipment used: Rolling walker (2 wheeled) Transfers: Sit to/from Stand Sit to Stand: Min assist         General transfer comment: min assist to power up and verbal cues for sequencing/technique  Ambulation/Gait Ambulation/Gait assistance: Min assist Ambulation Distance (Feet): 30 Feet Assistive device: Rolling walker (2 wheeled) Gait Pattern/deviations: Step-to pattern;Decreased stride length;Decreased weight shift to right Gait velocity: decreased      Stairs            Wheelchair Mobility    Modified Rankin (Stroke Patients Only)       Balance Overall balance assessment: Needs assistance Sitting-balance support: No upper extremity supported Sitting balance-Leahy Scale: Fair     Standing balance support: Bilateral upper extremity supported Standing balance-Leahy Scale:  Poor Standing balance comment: reliant on RW for balance                              Pertinent Vitals/Pain Pain Assessment: 0-10 Pain Score: 2  Pain Location: RLE Pain Descriptors / Indicators: Sore Pain Intervention(s): Limited activity within patient's tolerance;Monitored during session;Premedicated before session    Home Living Family/patient expects to be discharged to:: Private residence Living Arrangements: Spouse/significant other Available Help at Discharge: Family;Available 24 hours/day Type of Home: House Home Access: Stairs to enter Entrance Stairs-Rails: None Entrance Stairs-Number of Steps: 2 Home Layout: One level Home Equipment: Bedside commode Additional Comments: husband has had several shoulder surgeries and cannot do heavy lifting; daughter is Therapist, sports, available in evenings    Prior Function Level of Independence: Independent               Hand Dominance        Extremity/Trunk Assessment   Upper Extremity Assessment: Overall WFL for tasks assessed           Lower Extremity Assessment: Overall WFL for tasks assessed;RLE deficits/detail RLE Deficits / Details: knee flexion ~30 degrees; unable to straight leg raise       Communication   Communication: No difficulties  Cognition Arousal/Alertness: Awake/alert Behavior During Therapy: WFL for tasks assessed/performed Overall Cognitive Status: Within Functional Limits for tasks assessed                      General Comments      Exercises Total Joint Exercises Ankle Circles/Pumps: AROM;Both;10 reps;Supine;Strengthening Quad Sets: AROM;Both;10 reps;Supine;Strengthening Gluteal Sets:  AROM;Both;Strengthening;10 reps;Supine Short Arc Quad: AAROM;Right;10 reps;Supine Heel Slides: AAROM;Right;10 reps;Supine      Assessment/Plan    PT Assessment Patient needs continued PT services  PT Diagnosis Difficulty walking;Acute pain   PT Problem List Decreased range of  motion;Decreased activity tolerance;Decreased balance;Decreased mobility;Decreased knowledge of use of DME;Pain  PT Treatment Interventions DME instruction;Gait training;Stair training;Functional mobility training;Therapeutic activities;Therapeutic exercise;Balance training;Patient/family education   PT Goals (Current goals can be found in the Care Plan section) Acute Rehab PT Goals Patient Stated Goal: not hurt PT Goal Formulation: With patient/family Time For Goal Achievement: 10/03/15 Potential to Achieve Goals: Good    Frequency 7X/week   Barriers to discharge        Co-evaluation               End of Session Equipment Utilized During Treatment: Gait belt;Right knee immobilizer Activity Tolerance: No increased pain;Patient tolerated treatment well Patient left: in chair;with family/visitor present;with call bell/phone within reach           Time: 1003-1033 PT Time Calculation (min) (ACUTE ONLY): 30 min   Charges:   PT Evaluation $PT Eval Moderate Complexity: 1 Procedure PT Treatments $Gait Training: 8-22 mins   PT G Codes:        Shanna Cisco 09/26/2015, 10:38 AM  09/26/2015 [place], [place]

## 2015-09-26 NOTE — Evaluation (Addendum)
Occupational Therapy Evaluation Patient Details Name: Bonnie Cowan MRN: [id number] DOB: 1941-03-29 Today's Date: 09/26/2015    History of Present Illness Pt admitted for elective R TKR. PMH includes osteoarthritis and Rt meniscus tear.   Clinical Impression   Pt s/p above. PT independent with ADLs, PTA. Feel pt will benefit from acute OT to increase independence prior to d/c.      Follow Up Recommendations  No OT follow up;Supervision - Intermittent    Equipment Recommendations  Other (comment) (AE if wanted)    Recommendations for Other Services       Precautions / Restrictions Precautions Precautions: Knee;Fall Precaution Comments: educated on knee precautions Required Braces or Orthoses: Knee Immobilizer - Right Knee Immobilizer - Right: On except when in CPM Restrictions Weight Bearing Restrictions: Yes RLE Weight Bearing: Weight bearing as tolerated      Mobility Bed Mobility  General bed mobility comments: not assessed  Transfers Overall transfer level: Needs assistance Transfers: Sit to/from Stand Sit to Stand:  (see below)         General transfer comment: Supervision-Min guard for sit to stand; Pt with uncontrolled descent when going to sit on commode; Assist for stand to sit to chair without RW-Min assist    Balance Assist for ambulation without RW-Mod Assist. Min guard for ambulation with RW.                   ADL Overall ADL's : Needs assistance/impaired Eating/Feeding: Independent;Sitting                   Lower Body Dressing: Sit to/from stand;Moderate assistance   Toilet Transfer: Min guard;Ambulation;RW (3 in 1 over commode)           Functional mobility during ADLs:  (Min guard for ambulation with RW; Mod assist without RW) General ADL Comments: Educated on LB dressing technique. Explained benefit of reaching down to Rt foot for LB dressing as it allows knee to bend.      Vision     Perception     Praxis       Pertinent Vitals/Pain Pain Assessment: 0-10 Pain Score: 3  Pain Location: behind right knee Pain Descriptors / Indicators: Sore Pain Intervention(s): Ice applied;Repositioned;Monitored during session     Hand Dominance     Extremity/Trunk Assessment Upper Extremity Assessment Upper Extremity Assessment: Overall WFL for tasks assessed   Lower Extremity Assessment Lower Extremity Assessment: Defer to PT evaluation RLE Deficits / Details: knee flexion ~30 degrees; unable to straight leg raise RLE: Unable to fully assess due to pain       Communication Communication Communication: HOH   Cognition Arousal/Alertness: Awake/alert Behavior During Therapy: WFL for tasks assessed/performed Overall Cognitive Status: Within Functional Limits for tasks assessed                     General Comments       Exercises       Shoulder Instructions      Home Living Family/patient expects to be discharged to:: Private residence Living Arrangements: Spouse/significant other Available Help at Discharge: Family;Available 24 hours/day Type of Home: House Home Access: Stairs to enter [employer] of Steps: 2 Entrance Stairs-Rails: None Home Layout: One level     Bathroom Shower/Tub: Tub/shower unit;Walk-in shower         Home Equipment: Bedside commode   Additional Comments: husband has had several shoulder surgeries and cannot do heavy lifting; daughter is Therapist, sports, available in evenings  Prior Functioning/Environment Level of Independence: Independent             OT Diagnosis: Acute pain   OT Problem List: Decreased strength;Impaired balance (sitting and/or standing);Decreased knowledge of use of DME or AE;Decreased range of motion;Decreased activity tolerance;Pain   OT Treatment/Interventions: Self-care/ADL training;DME and/or AE instruction;Therapeutic activities;Patient/family education;Balance training    OT Goals(Current goals can be found in the  care plan section) Acute Rehab OT Goals Patient Stated Goal: not stated OT Goal Formulation: With patient Time For Goal Achievement: 10/03/15 Potential to Achieve Goals: Good ADL Goals Pt Will Perform Lower Body Dressing: with set-up;with supervision;sit to/from stand;with adaptive equipment Pt Will Transfer to Toilet: with set-up;ambulating;with supervision (3 in 1 over commode) Pt Will Perform Tub/Shower Transfer: with min guard assist;ambulating;rolling walker;3 in 1 (tub versus shower transfer-TBD)  OT Frequency: Min 2X/week   Barriers to D/C:            Co-evaluation              End of Session Equipment Utilized During Treatment: Gait belt;Right knee immobilizer;Rolling walker;Other (comment) (AE) CPM Right Knee CPM Right Knee: Off  Activity Tolerance: Patient tolerated treatment well (nauseous towards end of session) Patient left: in chair;with call bell/phone within reach;with family/visitor present   Time: [id number] OT Time Calculation (min): 21 min Charges:  OT General Charges $OT Visit: 1 Procedure OT Evaluation $OT Eval Moderate Complexity: 1 Procedure G-CodesBenito Mccreedy OTR/L [id number] 09/26/2015, 1:57 PM

## 2015-09-26 NOTE — Progress Notes (Signed)
ANTICOAGULATION CONSULT NOTE  Pharmacy Consult for warfarin Indication: VTE prophylaxis s/p R TKA   No Known Allergies  Patient Measurements: Weight: 116 lb (52.617 kg)  Vital Signs: Temp: 98 F (36.7 C) (03/18 0430) Temp Source: Oral (03/18 0028) BP: 143/60 mmHg (03/18 0430) Pulse Rate: 65 (03/18 0430)  Labs:  Recent Labs  09/25/15 1907 09/26/15 0721  HGB Bonnie.5* 10.4*  HCT 35.7* 32.8*  PLT 379 366  LABPROT  --  15.3*  INR  --  1.19  CREATININE 0.72 0.69    Estimated Creatinine Clearance: 48.1 mL/min (by C-G formula based on Cr of 0.69).   Assessment: Bonnie Cowan s/p R TKA to start Coumadin for VTE prophylaxis. Currently on Lovenox 30 mg IV Q 12 hours.   INR 1.19. Hgb and plts stable post-op. No bleeding noted.  Goal of Therapy:  INR 2-3 Monitor platelets by anticoagulation protocol: Yes   Plan:  -Warfarin 7.5mg  po x1 tonight  -Monitor daily PT/INR -Follow s/s bleeding  Cage Gupton D. Airiana Elman, PharmD, BCPS Clinical Pharmacist Pager: [phone] 09/26/2015 Bonnie:35 AM

## 2015-09-26 NOTE — Progress Notes (Signed)
Orthopedic Tech Progress Note Patient Details:  Bonnie Cowan Aug 23, 1940 [id number] cpm rounding. [name], female   DOB: 06-04-1941, 75 y.o.   MRN: [id number]   Braulio Bosch 09/26/2015, 7:25 PM

## 2015-09-26 NOTE — Progress Notes (Addendum)
Orthopedic Tech Progress Note Patient Details:  Bonnie Cowan [birth year] [id number]  Patient ID: [name], female   DOB: Jul 21, 1940, 75 y.o.   MRN: [id number] Applied cpm 0-35 due to pain. rn said will come turn up flexsion after giving pain meds  Karolee Stamps 09/26/2015, 5:52 AM

## 2015-09-26 NOTE — Care Management (Signed)
Utilization review completed. Mayfield Schoene, RN Case Manager [phone]. 

## 2015-09-26 NOTE — Progress Notes (Signed)
Physical Therapy Treatment Patient Details Name: Bonnie Cowan MRN: [id number] DOB: 1940/09/11 Today's Date: 09/26/2015    History of Present Illness Pt admitted for elective R TKR. PMH includes osteoarthritis and Rt meniscus tear.    PT Comments    Patient progressing with mobility and independence.  Family present for session.  Patient still requires assistance for mobility.  Will continue to see tomorrow.   Follow Up Recommendations  Home health PT     Equipment Recommendations  Rolling walker with 5" wheels    Recommendations for Other Services       Precautions / Restrictions Precautions Precautions: Knee;Fall Precaution Comments: educated on knee precautions Required Braces or Orthoses: Knee Immobilizer - Right Knee Immobilizer - Right: On except when in CPM Restrictions Weight Bearing Restrictions: Yes RLE Weight Bearing: Weight bearing as tolerated    Mobility  Bed Mobility Overal bed mobility: Needs Assistance Bed Mobility: Sit to Supine     Supine to sit: Min assist     General bed mobility comments: assist for RLE  Transfers Overall transfer level: Needs assistance Equipment used: Rolling walker (2 wheeled) Transfers: Sit to/from Stand Sit to Stand: Min assist         General transfer comment: required cueing for proper hand placement and sequencing; safety for balance when first standing  Ambulation/Gait Ambulation/Gait assistance: Min guard Ambulation Distance (Feet): 50 Feet Assistive device: Rolling walker (2 wheeled) Gait Pattern/deviations: Step-to pattern;Decreased stride length;Decreased weight shift to right Gait velocity: decreased   General Gait Details: slow pace   Stairs            Wheelchair Mobility    Modified Rankin (Stroke Patients Only)       Balance   Sitting-balance support: No upper extremity supported Sitting balance-Leahy Scale: Fair     Standing balance support: Bilateral upper extremity  supported Standing balance-Leahy Scale: Poor Standing balance comment: reliant on RW for balance                    Cognition Arousal/Alertness: Awake/alert Behavior During Therapy: WFL for tasks assessed/performed Overall Cognitive Status: Within Functional Limits for tasks assessed                      Exercises Total Joint Exercises Ankle Circles/Pumps: AROM;Both;10 reps;Strengthening;Seated Quad Sets: AROM;Both;10 reps;Strengthening;Seated Gluteal Sets: AROM;Both;Strengthening;10 reps;Seated Short Arc Quad: AAROM;Right;10 reps;Supine Heel Slides: AAROM;Right;10 reps;Supine Hip ABduction/ADduction: AAROM;Right;10 reps;Supine Straight Leg Raises: AAROM;Right;10 reps;Supine Long Arc Quad: AAROM;Right;Seated Knee Flexion: AAROM;Seated;Right;10 reps Goniometric ROM: knee flexion = 63    General Comments        Pertinent Vitals/Pain Pain Assessment: 0-10 Pain Score: 3  Pain Location: behind knee Pain Descriptors / Indicators: Sore Pain Intervention(s): Limited activity within patient's tolerance;Monitored during session;Premedicated before session    Home Living Family/patient expects to be discharged to:: Private residence Living Arrangements: Spouse/significant other Available Help at Discharge: Family;Available 24 hours/day Type of Home: House Home Access: Stairs to enter Entrance Stairs-Rails: None Home Layout: One level Home Equipment: Bedside commode Additional Comments: husband has had several shoulder surgeries and cannot do heavy lifting; daughter is Therapist, sports, available in evenings    Prior Function Level of Independence: Independent          PT Goals (current goals can now be found in the care plan section) Acute Rehab PT Goals Patient Stated Goal: not stated PT Goal Formulation: With patient/family Time For Goal Achievement: 10/03/15 Potential to Achieve Goals: Good Progress towards PT  goals: Progressing toward goals    Frequency   7X/week    PT Plan Current plan remains appropriate    Co-evaluation             End of Session Equipment Utilized During Treatment: Gait belt;Right knee immobilizer Activity Tolerance: No increased pain;Patient tolerated treatment well Patient left: with family/visitor present;with call bell/phone within reach;in bed     Time: 1400-1435 PT Time Calculation (min) (ACUTE ONLY): 35 min  Charges:  $Gait Training: 8-22 mins $Therapeutic Exercise: 8-22 mins                    G Codes:      Shanna Cisco October 17, 2015, 2:37 PM  10/17/2015 Bonnie Cowan, [place]

## 2015-09-27 LAB — CBC
HEMATOCRIT: 29.4 % — AB (ref 36.0–46.0)
HEMOGLOBIN: 9.5 g/dL — AB (ref 12.0–15.0)
MCH: 28.8 pg (ref 26.0–34.0)
MCHC: 32.3 g/dL (ref 30.0–36.0)
MCV: 89.1 fL (ref 78.0–100.0)
Platelets: 337 10*3/uL (ref 150–400)
RBC: 3.3 MIL/uL — AB (ref 3.87–5.11)
RDW: 14 % (ref 11.5–15.5)
WBC: 9.2 10*3/uL (ref 4.0–10.5)

## 2015-09-27 LAB — PROTIME-INR
INR: 1.97 — AB (ref 0.00–1.49)
Prothrombin Time: 22.3 seconds — ABNORMAL HIGH (ref 11.6–15.2)

## 2015-09-27 MED ORDER — WARFARIN SODIUM 5 MG PO TABS
2.5000 mg | ORAL_TABLET | Freq: Once | ORAL | Status: AC
  Administered 2015-09-27: 2.5 mg via ORAL
  Filled 2015-09-27: qty 0.5

## 2015-09-27 NOTE — Progress Notes (Addendum)
Occupational Therapy Treatment Patient Details Name: Bonnie Cowan MRN: [id number] DOB: 06/11/1941 Today's Date: 09/27/2015    History of present illness Pt admitted for elective R TKA. PMH includes osteoarthritis and Rt meniscus tear.   OT comments  Pt progressing. Education provided in session. Plan to see for one more session to practice tub or shower transfer technique.   Follow Up Recommendations  No OT follow up;Supervision - Intermittent    Equipment Recommendations  Other (comment) (AE if wanted)    Recommendations for Other Services      Precautions / Restrictions Precautions Precautions: Knee;Fall Precaution Booklet Issued: No Precaution Comments: educated on knee precautions Required Braces or Orthoses: Knee Immobilizer - Right Knee Immobilizer - Right: On except when in CPM Restrictions Weight Bearing Restrictions: Yes RLE Weight Bearing: Weight bearing as tolerated       Mobility Bed Mobility               General bed mobility comments: not assessed  Transfers Overall transfer level: Needs assistance Equipment used: Rolling walker (2 wheeled) Transfers: Sit to/from Stand Sit to Stand: Min guard         General transfer comment: Min guard-Supervision and Setup for RW    Balance    Pt managed LB clothing (pulled up over bottom) with Min guard assist provided.                                ADL Overall ADL's : Needs assistance/impaired                 Upper Body Dressing : Sitting Upper Body Dressing Details (indicate cue type and reason): daughter and OT assisted with gown/shirt, but feel pt could have done herself with set up assist. Lower Body Dressing: Minimal assistance;Sit to/from stand (not including knee immobilizer)   Toilet Transfer: Min guard;Ambulation;RW (sit to stand from chair)           Functional mobility during ADLs: Min guard;Rolling walker (sit to stand from chair-Min guard-Supervision&set up for  RW) General ADL Comments: Pt practiced donning underwear and pajama pants. Educated on tub and shower transfer techniques and options for shower chair. Educated on Stage manager. Pt not concerned about managing socks as she does not wear them much.      Vision                     Perception     Praxis      Cognition  Awake/Alert Behavior During Therapy: WFL for tasks assessed/performed Overall Cognitive Status: Within Functional Limits for tasks assessed                       Extremity/Trunk Assessment               Exercises     Shoulder Instructions       General Comments      Pertinent Vitals/ Pain       Pain Assessment: 0-10 Pain Score: 3  Pain Location: right knee Pain Intervention(s): Monitored during session;Repositioned  Home Living                                          Prior Functioning/Environment              Frequency  Min 2X/week     Progress Toward Goals  OT Goals(current goals can now be found in the care plan section)  Progress towards OT goals: Progressing toward goals  Acute Rehab OT Goals Patient Stated Goal: not stated OT Goal Formulation: With patient Time For Goal Achievement: 10/03/15 Potential to Achieve Goals: Good ADL Goals Pt Will Perform Lower Body Dressing: with set-up;with supervision;sit to/from stand;with adaptive equipment Pt Will Transfer to Toilet: with set-up;ambulating;with supervision (3 in 1 over commode) Pt Will Perform Tub/Shower Transfer: with min guard assist;ambulating;rolling walker;3 in 1 (tub transfer versus shower transfer-TBD)  Plan Discharge plan remains appropriate    Co-evaluation                 End of Session Equipment Utilized During Treatment: Gait belt;Rolling walker   Activity Tolerance Patient tolerated treatment well   Patient Left in chair;with call bell/phone within reach   Nurse Communication          Time: 0911-0929 OT Time  Calculation (min): 18 min  Charges: OT General Charges $OT Visit: 1 Procedure OT Treatments $Self Care/Home Management : 8-22 mins  Benito Mccreedy OTR/L [id number] 09/27/2015, 10:09 AM

## 2015-09-27 NOTE — Progress Notes (Addendum)
ANTICOAGULATION CONSULT NOTE  Pharmacy Consult for warfarin Indication: VTE prophylaxis s/p R TKA   No Known Allergies  Patient Measurements: Weight: 116 lb (52.617 kg)  Vital Signs: Temp: 97.9 F (36.6 C) (03/19 1148) Temp Source: Oral (03/19 1148) BP: 123/58 mmHg (03/19 0500) Pulse Rate: 64 (03/19 0500)  Labs:  Recent Labs  09/25/15 1907 09/26/15 0721 09/27/15 0434  HGB 11.5* 10.4* 9.5*  HCT 35.7* 32.8* 29.4*  PLT 379 366 337  LABPROT  --  15.3* 22.3*  INR  --  1.19 1.97*  CREATININE 0.72 0.69  --     Estimated Creatinine Clearance: 48.1 mL/min (by C-G formula based on Cr of 0.69).   Assessment: 50 YOF s/p R TKA to start Coumadin for VTE prophylaxis. Currently on Lovenox 30 mg IV Q 12 hours.   INR 1.19>1.97- very large jump after only 1 dose of warfarin. Hgb and plts stable post-op. No bleeding noted.  Goal of Therapy:  INR 2-3 Monitor platelets by anticoagulation protocol: Yes   Plan:  -Warfarin 2.5mg  po x1 tonight -Stop Lovenox as INR >1.8 as per order instructions -Monitor daily PT/INR -Follow s/s bleeding -Provide education prior to discharge- plan is for d/c tomorrow per Dr. Rolena Infante' note  Novia Lansberry D. Hajira Verhagen, PharmD, BCPS Clinical Pharmacist Pager: [phone] 09/27/2015 1:28 PM

## 2015-09-27 NOTE — Progress Notes (Signed)
Physical Therapy Treatment Patient Details Name: Bonnie Cowan MRN: [id number] DOB: 1940-11-15 Today's Date: 09/27/2015    History of Present Illness Pt admitted for elective R TKA. PMH includes osteoarthritis and Rt meniscus tear.    PT Comments    Pain continues to limit Bonnie Cowan, but she was very willing to participate still; Stair training complete;  On track for dc tomorrow  Follow Up Recommendations  Home health PT     Equipment Recommendations  Rolling walker with 5" wheels    Recommendations for Other Services       Precautions / Restrictions Precautions Precautions: Knee;Fall Precaution Comments: educated on knee precautions Required Braces or Orthoses: Knee Immobilizer - Right Knee Immobilizer - Right: On except when in CPM Restrictions RLE Weight Bearing: Weight bearing as tolerated    Mobility  Bed Mobility Overal bed mobility: Needs Assistance Bed Mobility: Supine to Sit     Supine to sit: Min assist     General bed mobility comments: Min assist to suppor RLE coming off of bed  Transfers Overall transfer level: Needs assistance Equipment used: Rolling walker (2 wheeled) Transfers: Sit to/from Stand Sit to Stand: Min guard         General transfer comment: Cues for technqiue; not needing physical assist  Ambulation/Gait Ambulation/Gait assistance: Min guard Ambulation Distance (Feet): 45 Feet Assistive device: Rolling walker (2 wheeled) Gait Pattern/deviations: Step-to pattern;Step-through pattern (cues for step-through pattern) Gait velocity: decreased   General Gait Details: slow pace; Cues to activate R quad for stance stability and to incr step length, L   Stairs Stairs: Yes Stairs assistance: Min assist Stair Management: No rails;With walker;Backwards;Step to pattern Number of Stairs: 2 General stair comments: Verbal and demo cues for technqiue; husband present and gave correct assist  Wheelchair Mobility    Modified Rankin  (Stroke Patients Only)       Balance     Sitting balance-Leahy Scale: Fair       Standing balance-Leahy Scale: Fair                      Cognition Arousal/Alertness: Awake/alert Behavior During Therapy: WFL for tasks assessed/performed Overall Cognitive Status: Within Functional Limits for tasks assessed                      Exercises      General Comments        Pertinent Vitals/Pain Pain Assessment: 0-10 Pain Score: 5  Pain Location: R knee at end of session Pain Descriptors / Indicators: Aching Pain Intervention(s): Monitored during session;Premedicated before session;Repositioned    Home Living                      Prior Function            PT Goals (current goals can now be found in the care plan section) Acute Rehab PT Goals Patient Stated Goal: to walk without pain PT Goal Formulation: With patient/family Time For Goal Achievement: 10/03/15 Potential to Achieve Goals: Good Progress towards PT goals: Progressing toward goals    Frequency  7X/week    PT Plan Current plan remains appropriate    Co-evaluation             End of Session Equipment Utilized During Treatment: Right knee immobilizer Activity Tolerance: Patient tolerated treatment well Patient left: in chair;with call bell/phone within reach;with family/visitor present     Time: [id number] PT Time Calculation (min) (ACUTE  ONLY): 29 min  Charges:  $Gait Training: 23-37 mins                    G Codes:      Bonnie Cowan 09/27/2015, 4:14 PM  Roney Marion, [address] Pager [phone] Office [phone]

## 2015-09-27 NOTE — Progress Notes (Signed)
    Subjective: 2 Days Post-Op Procedure(s) (LRB): RIGHT TOTAL KNEE ARTHROPLASTY (Right) Patient reports pain as 4 on 0-10 scale.   Denies CP or SOB.  Voiding without difficulty. Positive flatus. Objective: Vital signs in last 24 hours: Temp:  [97.9 F (36.6 C)-100.7 F (38.2 C)] 99.5 F (37.5 C) (03/19 0500) Pulse Rate:  [64-77] 64 (03/19 0500) Resp:  [18-20] 20 (03/19 0500) BP: (123-142)/(49-64) 123/58 mmHg (03/19 0500) SpO2:  [96 %-100 %] 96 % (03/19 0500)  Intake/Output from previous day: 03/18 0701 - 03/19 0700 In: 480 [P.O.:480] Out: -  Intake/Output this shift:    Labs:  Recent Labs  09/25/15 1907 09/26/15 0721 09/27/15 0434  HGB 11.5* 10.4* 9.5*    Recent Labs  09/26/15 0721 09/27/15 0434  WBC 9.5 9.2  RBC 3.67* 3.30*  HCT 32.8* 29.4*  PLT 366 337    Recent Labs  09/25/15 1907 09/26/15 0721  NA  --  135  K  --  4.0  CL  --  104  CO2  --  23  BUN  --  8  CREATININE 0.72 0.69  GLUCOSE  --  132*  CALCIUM  --  9.1    Recent Labs  09/26/15 0721 09/27/15 0434  INR 1.19 1.97*    Physical Exam: Neurologically intact ABD soft Incision: dressing C/D/I and no drainage Compartment soft  Assessment/Plan: 2 Days Post-Op Procedure(s) (LRB): RIGHT TOTAL KNEE ARTHROPLASTY (Right) Advance diet Up with therapy Plan for discharge tomorrow  Nikie Cid D for Dr. Melina Schools [facility] [phone] 09/27/2015, 6:25 AM

## 2015-09-27 NOTE — Progress Notes (Signed)
Physical Therapy Treatment Patient Details Name: Bonnie Cowan MRN: [id number] DOB: 01/31/1941 Today's Date: 09/27/2015    History of Present Illness Pt admitted for elective R TKA. PMH includes osteoarthritis and Rt meniscus tear.    PT Comments    Making very good progress with functional mobility; Noting nice stable knee in stance within KI -- will take a closer look at knee stability in stance and stair negotiation next session; on track for dc home tomorrow  Follow Up Recommendations  Home health PT     Equipment Recommendations  Rolling walker with 5" wheels    Recommendations for Other Services       Precautions / Restrictions Precautions Precautions: Knee;Fall Precaution Booklet Issued: Yes (comment) Precaution Comments: educated on knee precautions Required Braces or Orthoses: Knee Immobilizer - Right Knee Immobilizer - Right: On except when in CPM Restrictions Weight Bearing Restrictions: Yes RLE Weight Bearing: Weight bearing as tolerated    Mobility  Bed Mobility               General bed mobility comments: not assessed  Transfers Overall transfer level: Needs assistance Equipment used: Rolling walker (2 wheeled) Transfers: Sit to/from Stand Sit to Stand: Min guard         General transfer comment: Min guard-Supervision and Setup for RW  Ambulation/Gait Ambulation/Gait assistance: Min guard Ambulation Distance (Feet): 70 Feet Assistive device: Rolling walker (2 wheeled) Gait Pattern/deviations: Step-through pattern;Antalgic Gait velocity: decreased   General Gait Details: slow pace; Cues to activate R quad for stance stability and to incr step length, L   Stairs Stairs:  (Demonstrated technqieu for pt and daughter)          Wheelchair Mobility    Modified Rankin (Stroke Patients Only)       Balance     Sitting balance-Leahy Scale: Fair       Standing balance-Leahy Scale: Fair                      Cognition  Arousal/Alertness: Awake/alert Behavior During Therapy: WFL for tasks assessed/performed Overall Cognitive Status: Within Functional Limits for tasks assessed                      Exercises Total Joint Exercises Quad Sets: AROM;Both;10 reps;Strengthening;Seated Straight Leg Raises: AAROM;Right;10 reps;Supine Long Arc Quad: AAROM;Right;Seated Goniometric ROM: 3-75 deg, approx    General Comments        Pertinent Vitals/Pain Pain Assessment: 0-10 Pain Score: 7  Pain Location: R knee with therex; very little pain at rest Pain Descriptors / Indicators: Aching;Grimacing Pain Intervention(s): Monitored during session;Ice applied;Repositioned    Home Living                      Prior Function            PT Goals (current goals can now be found in the care plan section) Acute Rehab PT Goals Patient Stated Goal: not stated PT Goal Formulation: With patient/family Time For Goal Achievement: 10/03/15 Potential to Achieve Goals: Good Progress towards PT goals: Progressing toward goals    Frequency  7X/week    PT Plan Current plan remains appropriate    Co-evaluation             End of Session Equipment Utilized During Treatment: Right knee immobilizer Activity Tolerance: Patient tolerated treatment well Patient left: in chair;with call bell/phone within reach;with family/visitor present     Time: [id number] PT  Time Calculation (min) (ACUTE ONLY): 37 min  Charges:  $Gait Training: 8-22 mins $Therapeutic Exercise: 8-22 mins                    G Codes:      Roney Marion [facility] 09/27/2015, 10:46 AM  Roney Marion, [place] Pager [phone] Office [phone]

## 2015-09-28 ENCOUNTER — Encounter (HOSPITAL_COMMUNITY): Payer: Self-pay | Admitting: Orthopedic Surgery

## 2015-09-28 LAB — CBC
HCT: 30.9 % — ABNORMAL LOW (ref 36.0–46.0)
Hemoglobin: 9.7 g/dL — ABNORMAL LOW (ref 12.0–15.0)
MCH: 28.4 pg (ref 26.0–34.0)
MCHC: 31.4 g/dL (ref 30.0–36.0)
MCV: 90.4 fL (ref 78.0–100.0)
PLATELETS: 348 10*3/uL (ref 150–400)
RBC: 3.42 MIL/uL — AB (ref 3.87–5.11)
RDW: 13.9 % (ref 11.5–15.5)
WBC: 10.4 10*3/uL (ref 4.0–10.5)

## 2015-09-28 LAB — PROTIME-INR
INR: 2.44 — ABNORMAL HIGH (ref 0.00–1.49)
PROTHROMBIN TIME: 26.2 s — AB (ref 11.6–15.2)

## 2015-09-28 MED ORDER — WARFARIN VIDEO: Freq: Once | Status: DC

## 2015-09-28 MED ORDER — WARFARIN SODIUM 1 MG PO TABS
1.0000 mg | ORAL_TABLET | Freq: Once | ORAL | Status: DC
  Filled 2015-09-28: qty 1

## 2015-09-28 MED ORDER — COUMADIN BOOK
Freq: Once | Status: DC
  Filled 2015-09-28: qty 1

## 2015-09-28 NOTE — Progress Notes (Signed)
Pt ready for d/c home per MD. Cleared by PT/OT, necessary equipment has been delivered to pt's room. Discharge instructions and prescriptions reviewed with pt, and her husband and daughter; all questions answered. Belongings gathered and sent with pt's daughter. Assisted to car by volunteer services.  [place], Jerry Caras

## 2015-09-28 NOTE — Discharge Summary (Signed)
Physician Discharge Summary   [name] MRN: [id number] DOB/AGE: Aug 29, 1940 75 y.o.  Admit date: 09/25/2015 Discharge date: 09/28/2015  Admission Diagnoses:  Active Problems:   S/P total knee replacement using cement   Discharge Diagnoses:  Same   Surgeries: Procedure(s): RIGHT TOTAL KNEE ARTHROPLASTY on 09/25/2015   Consultants: PT/OT  Discharged Condition: Stable  Hospital Course: Bonnie Cowan is an 75 y.o. female who was admitted 09/25/2015 with a chief complaint of No chief complaint on file. , and found to have a diagnosis of <principal problem not specified>.  They were brought to the operating room on 09/25/2015 and underwent the above named procedures.    The patient had an uncomplicated hospital course and was stable for discharge.  Recent vital signs:  Filed Vitals:   09/27/15 2003 09/28/15 0557  BP: 152/88 116/56  Pulse: 72 61  Temp: 99.2 F (37.3 C) 98.2 F (36.8 C)  Resp: 18 16    Recent laboratory studies:  Results for orders placed or performed during the hospital encounter of 09/25/15  CBC  Result Value Ref Range   WBC 13.3 (H) 4.0 - 10.5 K/uL   RBC 3.95 3.87 - 5.11 MIL/uL   Hemoglobin 11.5 (L) 12.0 - 15.0 g/dL   HCT 35.7 (L) 36.0 - 46.0 %   MCV 90.4 78.0 - 100.0 fL   MCH 29.1 26.0 - 34.0 pg   MCHC 32.2 30.0 - 36.0 g/dL   RDW 13.7 11.5 - 15.5 %   Platelets 379 150 - 400 K/uL  Creatinine, serum  Result Value Ref Range   Creatinine, Ser 0.72 0.44 - 1.00 mg/dL   GFR calc non Af Amer >60 >60 mL/min   GFR calc Af Amer >60 >60 mL/min  Protime-INR  Result Value Ref Range   Prothrombin Time 15.3 (H) 11.6 - 15.2 seconds   INR 1.19 0.00 - 1.49  CBC  Result Value Ref Range   WBC 9.5 4.0 - 10.5 K/uL   RBC 3.67 (L) 3.87 - 5.11 MIL/uL   Hemoglobin 10.4 (L) 12.0 - 15.0 g/dL   HCT 32.8 (L) 36.0 - 46.0 %   MCV 89.4 78.0 - 100.0 fL   MCH 28.3 26.0 - 34.0 pg   MCHC 31.7 30.0 - 36.0 g/dL   RDW 13.8 11.5 - 15.5 %   Platelets 366 150 - 400 K/uL    Basic metabolic panel  Result Value Ref Range   Sodium 135 135 - 145 mmol/L   Potassium 4.0 3.5 - 5.1 mmol/L   Chloride 104 101 - 111 mmol/L   CO2 23 22 - 32 mmol/L   Glucose, Bld 132 (H) 65 - 99 mg/dL   BUN 8 6 - 20 mg/dL   Creatinine, Ser 0.69 0.44 - 1.00 mg/dL   Calcium 9.1 8.9 - 10.3 mg/dL   GFR calc non Af Amer >60 >60 mL/min   GFR calc Af Amer >60 >60 mL/min   Anion gap 8 5 - 15  Protime-INR  Result Value Ref Range   Prothrombin Time 22.3 (H) 11.6 - 15.2 seconds   INR 1.97 (H) 0.00 - 1.49  CBC  Result Value Ref Range   WBC 9.2 4.0 - 10.5 K/uL   RBC 3.30 (L) 3.87 - 5.11 MIL/uL   Hemoglobin 9.5 (L) 12.0 - 15.0 g/dL   HCT 29.4 (L) 36.0 - 46.0 %   MCV 89.1 78.0 - 100.0 fL   MCH 28.8 26.0 - 34.0 pg   MCHC 32.3 30.0 -  36.0 g/dL   RDW 14.0 11.5 - 15.5 %   Platelets 337 150 - 400 K/uL  Protime-INR  Result Value Ref Range   Prothrombin Time 26.2 (H) 11.6 - 15.2 seconds   INR 2.44 (H) 0.00 - 1.49  CBC  Result Value Ref Range   WBC 10.4 4.0 - 10.5 K/uL   RBC 3.42 (L) 3.87 - 5.11 MIL/uL   Hemoglobin 9.7 (L) 12.0 - 15.0 g/dL   HCT 30.9 (L) 36.0 - 46.0 %   MCV 90.4 78.0 - 100.0 fL   MCH 28.4 26.0 - 34.0 pg   MCHC 31.4 30.0 - 36.0 g/dL   RDW 13.9 11.5 - 15.5 %   Platelets 348 150 - 400 K/uL    Discharge Medications:     Medication List    STOP taking these medications        acetaminophen 500 MG tablet  Commonly known as:  TYLENOL     ciprofloxacin 500 MG tablet  Commonly known as:  CIPRO     metroNIDAZOLE 500 MG tablet  Commonly known as:  FLAGYL     ondansetron 4 MG tablet  Commonly known as:  ZOFRAN      TAKE these medications        aspirin EC 81 MG tablet  Take 1 tablet (81 mg total) by mouth daily.     BIOTIN PO  Take 1 tablet by mouth daily.     CALCIUM PO  Take 1 tablet by mouth daily.     methocarbamol 500 MG tablet  Commonly known as:  ROBAXIN  Take 1 tablet (500 mg total) by mouth 3 (three) times daily as needed.     MULTI VITAMIN  DAILY PO  Take 1 tablet by mouth daily.     oxyCODONE-acetaminophen 5-325 MG tablet  Commonly known as:  ROXICET  Take 1-2 tablets by mouth every 4 (four) hours as needed for severe pain.     warfarin 5 MG tablet  Commonly known as:  COUMADIN  Take 1 tablet (5 mg total) by mouth daily. Take as directed per the pharmacist for INR target from 2.5-3.0 for 30 days post op        Diagnostic Studies: Dg Chest 2 View  09/14/2015  CLINICAL DATA:  Cough, congestion, preop knee replacement EXAM: CHEST  2 VIEW COMPARISON:  None. FINDINGS: Lungs are clear.  No pleural effusion or pneumothorax. The heart is normal in size. Mild degenerative changes of the visualized thoracolumbar spine. IMPRESSION: No evidence of acute cardiopulmonary disease. Electronically Signed   By: Julian Hy M.D.   On: 09/14/2015 14:44   Dg Knee Right Port  09/25/2015  CLINICAL DATA:  Post total knee replacement RIGHT EXAM: PORTABLE RIGHT KNEE - 1-2 VIEW COMPARISON:  Portable exam 1650 hours without priors for comparison FINDINGS: Bones demineralized. Components of RIGHT knee prosthesis identified. No acute fracture dislocation. No periprosthetic lucency. Scattered regional soft tissue changes consistent with preceding surgery. Small amount of joint fluid within suprapatellar recess. IMPRESSION: RIGHT knee prosthesis without acute complication. Electronically Signed   By: Lavonia Dana M.D.   On: 09/25/2015 17:01    Disposition: 01-Home or Self Care        Follow-up Information    Follow up with NORRIS,STEVEN R, MD. Call in 2 weeks.   Specialty:  Orthopedic Surgery   Why:  [phone]   Contact information:   [address] [place] [address] [phone]       Follow  up with [facility].   Why:  Someone from [facility] will contact you concerning start date and time for therapy.    Contact information:   [address] [place] [address] [phone]         Signed: Ventura Bruns 09/28/2015, 10:28 AM

## 2015-09-28 NOTE — Progress Notes (Signed)
   Subjective: 3 Days Post-Op Procedure(s) (LRB): RIGHT TOTAL KNEE ARTHROPLASTY (Right)  Pt doing well Minimal pain to right knee Ready for d/c home Denies any new symptoms or issues Patient reports pain as mild.  Objective:   VITALS:   Filed Vitals:   09/27/15 2003 09/28/15 0557  BP: 152/88 116/56  Pulse: 72 61  Temp: 99.2 F (37.3 C) 98.2 F (36.8 C)  Resp: 18 16    Right knee incision healing well nv intact distally  LABS  Recent Labs  09/26/15 0721 09/27/15 0434 09/28/15 0243  HGB 10.4* 9.5* 9.7*  HCT 32.8* 29.4* 30.9*  WBC 9.5 9.2 10.4  PLT 366 337 348     Recent Labs  09/25/15 1907 09/26/15 0721  NA  --  135  K  --  4.0  BUN  --  8  CREATININE 0.72 0.69  GLUCOSE  --  132*     Assessment/Plan: 3 Days Post-Op Procedure(s) (LRB): RIGHT TOTAL KNEE ARTHROPLASTY (Right) D/c home today F/u in 2 weeks Denies any other issues    Merla Riches, MPAS, PA-C  09/28/2015, 10:26 AM

## 2015-09-28 NOTE — Progress Notes (Signed)
ANTICOAGULATION CONSULT NOTE  Pharmacy Consult for warfarin Indication: VTE prophylaxis s/p R TKA   No Known Allergies  Patient Measurements: Weight: 116 lb (52.617 kg)  Vital Signs: Temp: 98.2 F (36.8 C) (03/20 0557) Temp Source: Oral (03/20 0557) BP: 116/56 mmHg (03/20 0557) Pulse Rate: 61 (03/20 0557)  Labs:  Recent Labs  09/25/15 1907 09/26/15 0721 09/27/15 0434 09/28/15 0243  HGB 11.5* 10.4* 9.5* 9.7*  HCT 35.7* 32.8* 29.4* 30.9*  PLT 379 366 337 348  LABPROT  --  15.3* 22.3* 26.2*  INR  --  1.19 1.97* 2.44*  CREATININE 0.72 0.69  --   --     Estimated Creatinine Clearance: 48.1 mL/min (by C-G formula based on Cr of 0.69).   Assessment: 23 YOF s/p R TKA to start Coumadin for VTE prophylaxis.  INR 1.19>1.97>2.44 - large jumps after only 2 doses of warfarin. Hgb and plts stable post-op. No bleeding noted.  Goal of Therapy:  INR 2-3 Monitor platelets by anticoagulation protocol: Yes   Plan:  -Warfarin 1mg  po x1 tonight with large INR increases -Monitor daily INR -Follow s/s bleeding  Elicia Lamp, PharmD, BCPS Clinical Pharmacist Pager [phone] 09/28/2015 10:30 AM

## 2015-09-28 NOTE — Care Management Important Message (Signed)
Important Message  Patient Details  Name: RADIA FALSO MRN: [id number] Date of Birth: 07-03-1941   Medicare Important Message Given:  Yes    Barb Merino Clementina Mareno 09/28/2015, 3:54 PM

## 2015-09-28 NOTE — Progress Notes (Addendum)
Occupational Therapy Treatment Patient Details Name: Bonnie Cowan MRN: [id number] DOB: 1941-04-30 Today's Date: 09/28/2015    History of present illness Pt admitted for elective R TKA. PMH includes osteoarthritis and Rt meniscus tear.   OT comments  Pt progressing and education provided in session. Feel pt is safe for d/c home, from OT standpoint.  Follow Up Recommendations  No OT follow up;Supervision - Intermittent    Equipment Recommendations  Other (comment); FLAT Tub/shower seat; (AE if wanted)    Recommendations for Other Services      Precautions / Restrictions Precautions Precautions: Knee;Fall Precaution Booklet Issued: No Precaution Comments: educated on knee precautions Required Braces or Orthoses: Knee Immobilizer - Right Knee Immobilizer - Right: On except when in CPM Restrictions Weight Bearing Restrictions: Yes RLE Weight Bearing: Weight bearing as tolerated       Mobility Bed Mobility Overal bed mobility: Needs Assistance Bed Mobility: Supine to Sit     Supine to sit: Min assist     General bed mobility comments: assist with RLE. Talked about techniques she could use.  Transfers Overall transfer level: Needs assistance Equipment used: Rolling walker (2 wheeled) Transfers: Sit to/from Stand Sit to Stand: Supervision (and set up for RW)         General transfer comment: took a few steps from bed to chair and turned to sit in chair.    Balance Used  RW to take a few steps from bed to chair. No LOB in session.                   ADL Overall ADL's : Needs assistance/impaired Eating/Feeding: Independent;Bed level Eating/Feeding Details (indicate cue type and reason): drinking liquid                     Toilet Transfer: Set up;Supervision/safety;RW (sit to stand from bed; set up for RW; took a few steps and turned to sit in chair)           Functional mobility during ADLs: Supervision/safety;Rolling walker (and set up for  RW) General ADL Comments: OT donned knee immobilizer. Educated on safety such as use of bag on walker, safe footwear, sitting for LB ADLs. Educated on tub and shower transfer techniques and options for shower chair.  Talked about AE.  Reviewed LB Dressing technique. Pt able to verbalize LB dressing technique.       Vision                     Perception     Praxis      Cognition  Awake/Alert Behavior During Therapy: WFL for tasks assessed/performed Overall Cognitive Status: Within Functional Limits for tasks assessed                       Extremity/Trunk Assessment                  Shoulder Instructions       General Comments      Pertinent Vitals/ Pain       Pain Assessment: 0-10 Pain Score: 3  Pain Location: right knee Pain Intervention(s): Repositioned  Home Living                                          Prior Functioning/Environment  Frequency Min 2X/week     Progress Toward Goals  OT Goals(current goals can now be found in the care plan section)  Progress towards OT goals: Progressing toward goals (adequate for d/c)  Acute Rehab OT Goals Patient Stated Goal: not stated OT Goal Formulation: With patient Time For Goal Achievement: 10/03/15 Potential to Achieve Goals: Good ADL Goals Pt Will Perform Lower Body Dressing: with set-up;with supervision;sit to/from stand;with adaptive equipment Pt Will Transfer to Toilet: with set-up;ambulating;with supervision (3 in 1 over commode) Pt Will Perform Tub/Shower Transfer: with min guard assist;ambulating;rolling walker;3 in 1 (tub transfer versus shower transfer-TBD)  Plan Discharge plan remains appropriate;Equipment recommendations need to be updated    Co-evaluation                 End of Session Equipment Utilized During Treatment: Rolling walker;Right knee immobilizer CPM Right Knee CPM Right Knee: Off   Activity Tolerance Patient tolerated  treatment well   Patient Left with call bell/phone within reach;in chair;with family/visitor present   Nurse Communication          Time: [id number] OT Time Calculation (min): 18 min  Charges: OT General Charges $OT Visit: 1 Procedure OT Treatments $Self Care/Home Management : 8-22 mins  Benito Mccreedy OTR/L [id number] 09/28/2015, 1:17 PM

## 2015-09-28 NOTE — Progress Notes (Signed)
Physical Therapy Treatment Patient Details Name: Bonnie Cowan MRN: [id number] DOB: Dec 01, 1940 Today's Date: 09/28/2015    History of Present Illness Pt admitted for elective R TKA. PMH includes osteoarthritis and Rt meniscus tear.    PT Comments    Patient is making good progress with PT.  From a mobility standpoint anticipate patient will be ready for DC home when medically ready.     Follow Up Recommendations  Home health PT     Equipment Recommendations  Rolling walker with 5" wheels    Recommendations for Other Services       Precautions / Restrictions Precautions Precautions: Knee;Fall Precaution Comments: educated on knee precautions Required Braces or Orthoses: Knee Immobilizer - Right Knee Immobilizer - Right: On except when in CPM Restrictions Weight Bearing Restrictions: Yes RLE Weight Bearing: Weight bearing as tolerated    Mobility  Bed Mobility               General bed mobility comments: OOB in chair upon  arrival  Transfers Overall transfer level: Needs assistance Equipment used: Rolling walker (2 wheeled) Transfers: Sit to/from Stand Sit to Stand: Supervision         General transfer comment: carry over of safe hand placement and technique; supervision for safety  Ambulation/Gait Ambulation/Gait assistance: Supervision Ambulation Distance (Feet): 120 Feet Assistive device: Rolling walker (2 wheeled) Gait Pattern/deviations: Step-through pattern;Decreased step length - left;Decreased stance time - right;Antalgic;Trunk flexed Gait velocity: decreased   General Gait Details: slow and steady gait with improved gait mechanics this session; cues for step through pattern, sequencing, and posture; no knee buckling noted   Stairs Stairs: Yes Stairs assistance: Min guard Stair Management: No rails;Backwards;With walker Number of Stairs: 2 General stair comments: cues for RW placement; pt and husband demonstrated carry over of sequnecing and  technique with husband stabilizing RW; good safety awareness and no unsteadiness  Wheelchair Mobility    Modified Rankin (Stroke Patients Only)       Balance Overall balance assessment: Needs assistance Sitting-balance support: No upper extremity supported;Feet supported Sitting balance-Leahy Scale: Good     Standing balance support: Bilateral upper extremity supported Standing balance-Leahy Scale: Fair                      Cognition Arousal/Alertness: Awake/alert Behavior During Therapy: WFL for tasks assessed/performed Overall Cognitive Status: Within Functional Limits for tasks assessed                      Exercises Total Joint Exercises Quad Sets: AROM;Right;5 reps;Seated Heel Slides: AROM;Right;10 reps;Seated Straight Leg Raises: AROM;Right;5 reps;Seated Goniometric ROM: 0-60    General Comments General comments (skin integrity, edema, etc.): educated pt on HEP, use of ice and elevation, and positioning; husband present for session and actively participating      Pertinent Vitals/Pain Pain Assessment: 0-10 Pain Score: 1  Pain Location: R knee Pain Descriptors / Indicators: Sore Pain Intervention(s): Monitored during session;Repositioned;RN gave pain meds during session    Home Living                      Prior Function            PT Goals (current goals can now be found in the care plan section) Acute Rehab PT Goals Patient Stated Goal: to walk without pain PT Goal Formulation: With patient/family Time For Goal Achievement: 10/03/15 Potential to Achieve Goals: Good Progress towards PT goals: Progressing toward goals  Frequency  7X/week    PT Plan Current plan remains appropriate    Co-evaluation             End of Session Equipment Utilized During Treatment: Right knee immobilizer;Gait belt Activity Tolerance: Patient tolerated treatment well Patient left: in chair;with call bell/phone within reach;with  family/visitor present     Time: [id number] PT Time Calculation (min) (ACUTE ONLY): 36 min  Charges:  $Gait Training: 8-22 mins $Therapeutic Exercise: 8-22 mins                    G Codes:      Salina April, PTA Pager: [phone]   09/28/2015, 9:39 AM

## 2015-09-29 DIAGNOSIS — Z96651 Presence of right artificial knee joint: Secondary | ICD-10-CM | POA: Diagnosis not present

## 2015-09-29 DIAGNOSIS — Z471 Aftercare following joint replacement surgery: Secondary | ICD-10-CM | POA: Diagnosis not present

## 2015-09-29 DIAGNOSIS — Z7901 Long term (current) use of anticoagulants: Secondary | ICD-10-CM | POA: Diagnosis not present

## 2015-09-29 DIAGNOSIS — Z5181 Encounter for therapeutic drug level monitoring: Secondary | ICD-10-CM | POA: Diagnosis not present

## 2015-09-29 NOTE — Anesthesia Postprocedure Evaluation (Addendum)
Anesthesia Post Note  Patient: Bonnie Cowan  Procedure(s) Performed: Procedure(s) (LRB): RIGHT TOTAL KNEE ARTHROPLASTY (Right)  Patient location during evaluation: PACU Anesthesia Type: General and Regional Level of consciousness: sedated and patient cooperative Pain management: pain level controlled Vital Signs Assessment: post-procedure vital signs reviewed and stable Respiratory status: spontaneous breathing Cardiovascular status: stable Anesthetic complications: no    Last Vitals:  Filed Vitals:   09/27/15 2003 09/28/15 0557  BP: 152/88 116/56  Pulse: 72 61  Temp: 37.3 C 36.8 C  Resp: 18 16    Last Pain:  Filed Vitals:   09/28/15 1315  PainSc: [place]

## 2015-09-30 DIAGNOSIS — Z5181 Encounter for therapeutic drug level monitoring: Secondary | ICD-10-CM | POA: Diagnosis not present

## 2015-09-30 DIAGNOSIS — Z7901 Long term (current) use of anticoagulants: Secondary | ICD-10-CM | POA: Diagnosis not present

## 2015-09-30 DIAGNOSIS — Z471 Aftercare following joint replacement surgery: Secondary | ICD-10-CM | POA: Diagnosis not present

## 2015-09-30 DIAGNOSIS — Z96651 Presence of right artificial knee joint: Secondary | ICD-10-CM | POA: Diagnosis not present

## 2015-10-01 DIAGNOSIS — Z471 Aftercare following joint replacement surgery: Secondary | ICD-10-CM | POA: Diagnosis not present

## 2015-10-01 DIAGNOSIS — Z7901 Long term (current) use of anticoagulants: Secondary | ICD-10-CM | POA: Diagnosis not present

## 2015-10-01 DIAGNOSIS — Z5181 Encounter for therapeutic drug level monitoring: Secondary | ICD-10-CM | POA: Diagnosis not present

## 2015-10-01 DIAGNOSIS — Z96651 Presence of right artificial knee joint: Secondary | ICD-10-CM | POA: Diagnosis not present

## 2015-10-02 DIAGNOSIS — Z96651 Presence of right artificial knee joint: Secondary | ICD-10-CM | POA: Diagnosis not present

## 2015-10-02 DIAGNOSIS — Z471 Aftercare following joint replacement surgery: Secondary | ICD-10-CM | POA: Diagnosis not present

## 2015-10-02 DIAGNOSIS — Z5181 Encounter for therapeutic drug level monitoring: Secondary | ICD-10-CM | POA: Diagnosis not present

## 2015-10-02 DIAGNOSIS — Z7901 Long term (current) use of anticoagulants: Secondary | ICD-10-CM | POA: Diagnosis not present

## 2015-10-05 DIAGNOSIS — Z7901 Long term (current) use of anticoagulants: Secondary | ICD-10-CM | POA: Diagnosis not present

## 2015-10-05 DIAGNOSIS — Z471 Aftercare following joint replacement surgery: Secondary | ICD-10-CM | POA: Diagnosis not present

## 2015-10-05 DIAGNOSIS — Z96651 Presence of right artificial knee joint: Secondary | ICD-10-CM | POA: Diagnosis not present

## 2015-10-05 DIAGNOSIS — Z5181 Encounter for therapeutic drug level monitoring: Secondary | ICD-10-CM | POA: Diagnosis not present

## 2015-10-06 DIAGNOSIS — M1711 Unilateral primary osteoarthritis, right knee: Secondary | ICD-10-CM | POA: Diagnosis not present

## 2015-10-06 DIAGNOSIS — Z7901 Long term (current) use of anticoagulants: Secondary | ICD-10-CM | POA: Diagnosis not present

## 2015-10-08 DIAGNOSIS — M1711 Unilateral primary osteoarthritis, right knee: Secondary | ICD-10-CM | POA: Diagnosis not present

## 2015-10-08 DIAGNOSIS — Z471 Aftercare following joint replacement surgery: Secondary | ICD-10-CM | POA: Diagnosis not present

## 2015-10-08 DIAGNOSIS — Z96651 Presence of right artificial knee joint: Secondary | ICD-10-CM | POA: Diagnosis not present

## 2015-10-12 DIAGNOSIS — M1711 Unilateral primary osteoarthritis, right knee: Secondary | ICD-10-CM | POA: Diagnosis not present

## 2015-10-12 DIAGNOSIS — Z5181 Encounter for therapeutic drug level monitoring: Secondary | ICD-10-CM | POA: Diagnosis not present

## 2015-10-16 DIAGNOSIS — M1711 Unilateral primary osteoarthritis, right knee: Secondary | ICD-10-CM | POA: Diagnosis not present

## 2015-10-19 DIAGNOSIS — Z5181 Encounter for therapeutic drug level monitoring: Secondary | ICD-10-CM | POA: Diagnosis not present

## 2015-10-19 DIAGNOSIS — M1711 Unilateral primary osteoarthritis, right knee: Secondary | ICD-10-CM | POA: Diagnosis not present

## 2015-10-22 DIAGNOSIS — M1711 Unilateral primary osteoarthritis, right knee: Secondary | ICD-10-CM | POA: Diagnosis not present

## 2015-10-26 DIAGNOSIS — M1711 Unilateral primary osteoarthritis, right knee: Secondary | ICD-10-CM | POA: Diagnosis not present

## 2015-10-30 DIAGNOSIS — M1711 Unilateral primary osteoarthritis, right knee: Secondary | ICD-10-CM | POA: Diagnosis not present

## 2015-11-02 DIAGNOSIS — M1711 Unilateral primary osteoarthritis, right knee: Secondary | ICD-10-CM | POA: Diagnosis not present

## 2015-11-05 DIAGNOSIS — Z96651 Presence of right artificial knee joint: Secondary | ICD-10-CM | POA: Diagnosis not present

## 2015-11-05 DIAGNOSIS — Z471 Aftercare following joint replacement surgery: Secondary | ICD-10-CM | POA: Diagnosis not present

## 2015-11-05 DIAGNOSIS — M1711 Unilateral primary osteoarthritis, right knee: Secondary | ICD-10-CM | POA: Diagnosis not present

## 2015-11-09 DIAGNOSIS — M1711 Unilateral primary osteoarthritis, right knee: Secondary | ICD-10-CM | POA: Diagnosis not present

## 2015-11-12 DIAGNOSIS — M1711 Unilateral primary osteoarthritis, right knee: Secondary | ICD-10-CM | POA: Diagnosis not present

## 2015-11-16 DIAGNOSIS — M1711 Unilateral primary osteoarthritis, right knee: Secondary | ICD-10-CM | POA: Diagnosis not present

## 2015-11-19 DIAGNOSIS — M1711 Unilateral primary osteoarthritis, right knee: Secondary | ICD-10-CM | POA: Diagnosis not present

## 2015-11-23 DIAGNOSIS — M1711 Unilateral primary osteoarthritis, right knee: Secondary | ICD-10-CM | POA: Diagnosis not present

## 2015-11-26 DIAGNOSIS — M1711 Unilateral primary osteoarthritis, right knee: Secondary | ICD-10-CM | POA: Diagnosis not present

## 2015-11-30 DIAGNOSIS — M1711 Unilateral primary osteoarthritis, right knee: Secondary | ICD-10-CM | POA: Diagnosis not present

## 2015-12-03 DIAGNOSIS — M1711 Unilateral primary osteoarthritis, right knee: Secondary | ICD-10-CM | POA: Diagnosis not present

## 2015-12-15 DIAGNOSIS — M1711 Unilateral primary osteoarthritis, right knee: Secondary | ICD-10-CM | POA: Diagnosis not present

## 2015-12-15 DIAGNOSIS — Z471 Aftercare following joint replacement surgery: Secondary | ICD-10-CM | POA: Diagnosis not present

## 2015-12-15 DIAGNOSIS — Z96651 Presence of right artificial knee joint: Secondary | ICD-10-CM | POA: Diagnosis not present

## 2015-12-18 DIAGNOSIS — R1084 Generalized abdominal pain: Secondary | ICD-10-CM | POA: Diagnosis not present

## 2016-02-09 DIAGNOSIS — Z96651 Presence of right artificial knee joint: Secondary | ICD-10-CM | POA: Diagnosis not present

## 2016-02-09 DIAGNOSIS — Z471 Aftercare following joint replacement surgery: Secondary | ICD-10-CM | POA: Diagnosis not present

## 2016-03-28 DIAGNOSIS — Z23 Encounter for immunization: Secondary | ICD-10-CM | POA: Diagnosis not present

## 2016-08-31 DIAGNOSIS — H43393 Other vitreous opacities, bilateral: Secondary | ICD-10-CM | POA: Diagnosis not present

## 2016-08-31 DIAGNOSIS — H2513 Age-related nuclear cataract, bilateral: Secondary | ICD-10-CM | POA: Diagnosis not present

## 2016-08-31 DIAGNOSIS — H04123 Dry eye syndrome of bilateral lacrimal glands: Secondary | ICD-10-CM | POA: Diagnosis not present

## 2016-09-06 DIAGNOSIS — Z471 Aftercare following joint replacement surgery: Secondary | ICD-10-CM | POA: Diagnosis not present

## 2016-09-06 DIAGNOSIS — Z96651 Presence of right artificial knee joint: Secondary | ICD-10-CM | POA: Diagnosis not present

## 2017-04-11 DIAGNOSIS — Z23 Encounter for immunization: Secondary | ICD-10-CM | POA: Diagnosis not present

## 2017-11-02 DIAGNOSIS — H5211 Myopia, right eye: Secondary | ICD-10-CM | POA: Diagnosis not present

## 2017-11-02 DIAGNOSIS — H52223 Regular astigmatism, bilateral: Secondary | ICD-10-CM | POA: Diagnosis not present

## 2017-11-02 DIAGNOSIS — H524 Presbyopia: Secondary | ICD-10-CM | POA: Diagnosis not present

## 2017-11-02 DIAGNOSIS — H5202 Hypermetropia, left eye: Secondary | ICD-10-CM | POA: Diagnosis not present

## 2018-02-01 DIAGNOSIS — M25511 Pain in right shoulder: Secondary | ICD-10-CM | POA: Diagnosis not present

## 2018-02-01 DIAGNOSIS — M19011 Primary osteoarthritis, right shoulder: Secondary | ICD-10-CM | POA: Diagnosis not present

## 2018-03-26 DIAGNOSIS — M25539 Pain in unspecified wrist: Secondary | ICD-10-CM | POA: Diagnosis not present

## 2018-03-26 DIAGNOSIS — R030 Elevated blood-pressure reading, without diagnosis of hypertension: Secondary | ICD-10-CM | POA: Diagnosis not present

## 2018-03-26 DIAGNOSIS — Z23 Encounter for immunization: Secondary | ICD-10-CM | POA: Diagnosis not present

## 2018-03-29 DIAGNOSIS — L814 Other melanin hyperpigmentation: Secondary | ICD-10-CM | POA: Diagnosis not present

## 2018-03-29 DIAGNOSIS — L570 Actinic keratosis: Secondary | ICD-10-CM | POA: Diagnosis not present

## 2018-03-29 DIAGNOSIS — L819 Disorder of pigmentation, unspecified: Secondary | ICD-10-CM | POA: Diagnosis not present

## 2018-04-23 DIAGNOSIS — L820 Inflamed seborrheic keratosis: Secondary | ICD-10-CM | POA: Diagnosis not present

## 2018-04-23 DIAGNOSIS — L570 Actinic keratosis: Secondary | ICD-10-CM | POA: Diagnosis not present

## 2018-04-23 DIAGNOSIS — D485 Neoplasm of uncertain behavior of skin: Secondary | ICD-10-CM | POA: Diagnosis not present

## 2018-05-17 DIAGNOSIS — M25532 Pain in left wrist: Secondary | ICD-10-CM | POA: Diagnosis not present

## 2018-05-17 DIAGNOSIS — M25531 Pain in right wrist: Secondary | ICD-10-CM | POA: Diagnosis not present

## 2018-06-28 DIAGNOSIS — M19011 Primary osteoarthritis, right shoulder: Secondary | ICD-10-CM | POA: Diagnosis not present

## 2018-09-10 DIAGNOSIS — E785 Hyperlipidemia, unspecified: Secondary | ICD-10-CM | POA: Diagnosis not present

## 2018-09-10 DIAGNOSIS — Z0000 Encounter for general adult medical examination without abnormal findings: Secondary | ICD-10-CM | POA: Diagnosis not present

## 2018-09-12 DIAGNOSIS — Z0000 Encounter for general adult medical examination without abnormal findings: Secondary | ICD-10-CM | POA: Diagnosis not present

## 2018-09-12 DIAGNOSIS — R030 Elevated blood-pressure reading, without diagnosis of hypertension: Secondary | ICD-10-CM | POA: Diagnosis not present

## 2018-09-12 DIAGNOSIS — Z23 Encounter for immunization: Secondary | ICD-10-CM | POA: Diagnosis not present

## 2018-09-12 DIAGNOSIS — K219 Gastro-esophageal reflux disease without esophagitis: Secondary | ICD-10-CM | POA: Diagnosis not present

## 2018-09-12 DIAGNOSIS — E785 Hyperlipidemia, unspecified: Secondary | ICD-10-CM | POA: Diagnosis not present

## 2018-09-20 DIAGNOSIS — I10 Essential (primary) hypertension: Secondary | ICD-10-CM | POA: Diagnosis not present

## 2018-11-20 DIAGNOSIS — I10 Essential (primary) hypertension: Secondary | ICD-10-CM | POA: Diagnosis not present

## 2018-12-11 DIAGNOSIS — M19011 Primary osteoarthritis, right shoulder: Secondary | ICD-10-CM | POA: Diagnosis not present

## 2018-12-11 DIAGNOSIS — M25511 Pain in right shoulder: Secondary | ICD-10-CM | POA: Diagnosis not present

## 2019-03-13 DIAGNOSIS — L570 Actinic keratosis: Secondary | ICD-10-CM | POA: Diagnosis not present

## 2019-03-13 DIAGNOSIS — L821 Other seborrheic keratosis: Secondary | ICD-10-CM | POA: Diagnosis not present

## 2019-04-02 DIAGNOSIS — Z23 Encounter for immunization: Secondary | ICD-10-CM | POA: Diagnosis not present

## 2019-08-05 ENCOUNTER — Ambulatory Visit: Payer: Medicare Other | Attending: Internal Medicine

## 2019-08-05 DIAGNOSIS — Z23 Encounter for immunization: Secondary | ICD-10-CM | POA: Insufficient documentation

## 2019-08-23 ENCOUNTER — Ambulatory Visit: Payer: Medicare Other

## 2019-08-26 ENCOUNTER — Ambulatory Visit: Payer: Medicare Other | Attending: Internal Medicine

## 2019-08-26 DIAGNOSIS — Z23 Encounter for immunization: Secondary | ICD-10-CM | POA: Insufficient documentation

## 2019-08-26 NOTE — Progress Notes (Signed)
   Covid-19 Vaccination Clinic  Name:  Bonnie Cowan    MRN: [id number] DOB: [birth year]  08/26/2019  Bonnie Cowan was observed post Covid-19 immunization for 15 minutes without incidence. She was provided with Vaccine Information Sheet and instruction to access the V-Safe system.   Bonnie Cowan was instructed to call 911 with any severe reactions post vaccine: Marland Kitchen Difficulty breathing  . Swelling of your face and throat  . A fast heartbeat  . A bad rash all over your body  . Dizziness and weakness    Immunizations Administered    Name Date Dose VIS Date Route   Pfizer COVID-19 Vaccine 08/26/2019  2:59 PM 0.3 mL 06/21/2019 Intramuscular   Manufacturer: [place]   Lot: [id number]   [place]: [id number]

## 2019-09-24 DIAGNOSIS — D72820 Lymphocytosis (symptomatic): Secondary | ICD-10-CM | POA: Diagnosis not present

## 2019-09-24 DIAGNOSIS — Z0000 Encounter for general adult medical examination without abnormal findings: Secondary | ICD-10-CM | POA: Diagnosis not present

## 2019-09-24 DIAGNOSIS — E2839 Other primary ovarian failure: Secondary | ICD-10-CM | POA: Diagnosis not present

## 2019-09-24 DIAGNOSIS — R030 Elevated blood-pressure reading, without diagnosis of hypertension: Secondary | ICD-10-CM | POA: Diagnosis not present

## 2019-09-24 DIAGNOSIS — K219 Gastro-esophageal reflux disease without esophagitis: Secondary | ICD-10-CM | POA: Diagnosis not present

## 2019-09-24 DIAGNOSIS — E785 Hyperlipidemia, unspecified: Secondary | ICD-10-CM | POA: Diagnosis not present

## 2019-09-26 ENCOUNTER — Other Ambulatory Visit: Payer: Self-pay | Admitting: Family Medicine

## 2019-09-26 DIAGNOSIS — E2839 Other primary ovarian failure: Secondary | ICD-10-CM

## 2019-10-08 DIAGNOSIS — Z0000 Encounter for general adult medical examination without abnormal findings: Secondary | ICD-10-CM | POA: Diagnosis not present

## 2019-10-08 DIAGNOSIS — E785 Hyperlipidemia, unspecified: Secondary | ICD-10-CM | POA: Diagnosis not present

## 2019-10-08 DIAGNOSIS — Z7983 Long term (current) use of bisphosphonates: Secondary | ICD-10-CM | POA: Diagnosis not present

## 2019-10-08 DIAGNOSIS — D72820 Lymphocytosis (symptomatic): Secondary | ICD-10-CM | POA: Diagnosis not present

## 2019-10-14 DIAGNOSIS — L821 Other seborrheic keratosis: Secondary | ICD-10-CM | POA: Diagnosis not present

## 2019-10-14 DIAGNOSIS — L819 Disorder of pigmentation, unspecified: Secondary | ICD-10-CM | POA: Diagnosis not present

## 2019-10-14 DIAGNOSIS — L814 Other melanin hyperpigmentation: Secondary | ICD-10-CM | POA: Diagnosis not present

## 2019-10-14 DIAGNOSIS — D229 Melanocytic nevi, unspecified: Secondary | ICD-10-CM | POA: Diagnosis not present

## 2019-10-24 DIAGNOSIS — H259 Unspecified age-related cataract: Secondary | ICD-10-CM | POA: Diagnosis not present

## 2019-11-06 DIAGNOSIS — H16223 Keratoconjunctivitis sicca, not specified as Sjogren's, bilateral: Secondary | ICD-10-CM | POA: Diagnosis not present

## 2019-11-06 DIAGNOSIS — H2513 Age-related nuclear cataract, bilateral: Secondary | ICD-10-CM | POA: Diagnosis not present

## 2019-11-06 DIAGNOSIS — Q141 Congenital malformation of retina: Secondary | ICD-10-CM | POA: Diagnosis not present

## 2019-11-11 NOTE — Progress Notes (Signed)
[facility] Note  11/14/2019     CHIEF COMPLAINT Patient presents for Retina Evaluation   HISTORY OF PRESENT ILLNESS: Bonnie Cowan is a 79 y.o. female who presents to the clinic today for:   HPI    Retina Evaluation    In right eye.  Onset: Unknown.  Duration: Unknown.  Context:  distance vision, mid-range vision and near vision.  Treatments tried include artificial tears.  I, the attending physician,  performed the HPI with the patient and updated documentation appropriately.          Comments    79 y/o female pt here referred by Dr. Zenia Resides 3 days ago for retinal photos and eval of CHRPE nasally OD.  VA good OU cc.  Denies pain, FOL, floaters.  Systane prn OU.       Last edited by Bernarda Caffey, MD on 11/14/2019  9:36 AM. (History)    pt is here on the referral of Dr. Wyatt Portela for retinal photos/eval of a right eye CHRPE, pt states she has no other eye problems that he is being followed for, she has left eye cataract sx scheduled with Dr. Katy Fitch, she denies being diabetic, but states she has high blood pressure  Referring physician: Debbra Riding, MD [address],  [place] [address]  HISTORICAL INFORMATION:   Selected notes from the MEDICAL RECORD NUMBER Referred by Dr. Katy Fitch   CURRENT MEDICATIONS: Current Outpatient Medications (Ophthalmic Drugs)  Medication Sig  . Prednisolon-Gatiflox-Bromfenac 1-0.5-0.075 % SUSP Place 1 drop into the left eye 4 (four) times daily.   No current facility-administered medications for this visit. (Ophthalmic Drugs)   Current Outpatient Medications (Other)  Medication Sig  . amLODipine (NORVASC) 10 MG tablet   . aspirin EC 81 MG tablet Take 1 tablet (81 mg total) by mouth daily.  Marland Kitchen BIOTIN PO Take 1 tablet by mouth daily.  Marland Kitchen CALCIUM PO Take 1 tablet by mouth daily.  . methocarbamol (ROBAXIN) 500 MG tablet Take 1 tablet (500 mg total) by mouth 3 (three) times daily as needed.  . Multiple  Vitamin (MULTI VITAMIN DAILY PO) Take 1 tablet by mouth daily.  Marland Kitchen oxyCODONE-acetaminophen (ROXICET) 5-325 MG tablet Take 1-2 tablets by mouth every 4 (four) hours as needed for severe pain.  . pravastatin (PRAVACHOL) 10 MG tablet   . warfarin (COUMADIN) 5 MG tablet Take 1 tablet (5 mg total) by mouth daily. Take as directed per the pharmacist for INR target from 2.5-3.0 for 30 days post op  . Zoster Vaccine Adjuvanted [facility]) injection Shingrix (PF) 50 mcg/0.5 mL intramuscular suspension, kit   No current facility-administered medications for this visit. (Other)      REVIEW OF SYSTEMS: ROS    Positive for: Gastrointestinal, Eyes   Negative for: Constitutional, Neurological, Skin, Genitourinary, Musculoskeletal, HENT, Endocrine, Cardiovascular, Respiratory, Psychiatric, Allergic/Imm, Heme/Lymph   Last edited by Matthew Folks, COA on 11/14/2019  9:14 AM. (History)       ALLERGIES No Known Allergies  PAST MEDICAL HISTORY Past Medical History:  Diagnosis Date  . Arthritis   . Cataract    OU  . Cold   . GERD (gastroesophageal reflux disease)   . Meniscus tear    right knee   Past Surgical History:  Procedure Laterality Date  . ABDOMINAL HYSTERECTOMY    . TOTAL KNEE ARTHROPLASTY Right 09/25/2015   Procedure: RIGHT TOTAL KNEE ARTHROPLASTY;  Surgeon: Netta Cedars, MD;  Location:  [facility];  Service: Orthopedics;  Laterality: Right;    FAMILY HISTORY Family History  Problem Relation Age of Onset  . Heart disease Mother     SOCIAL HISTORY Social History   Tobacco Use  . Smoking status: Never Smoker  Substance Use Topics  . Alcohol use: No  . Drug use: No         OPHTHALMIC EXAM:  Base Eye Exam    Visual Acuity (Snellen - Linear)      Right Left   Dist cc 20/40 20/40   Dist ph cc 20/25 -2 20/25 -2   Correction: Contacts       Tonometry (Tonopen, 9:15 AM)      Right Left   Pressure 13 15       Pupils      Dark Light Shape React APD   Right 3 2 Round  Brisk None   Left 3 2 Round Brisk None       Visual Fields (Counting fingers)      Left Right    Full Full       Extraocular Movement      Right Left    Full, Ortho Full, Ortho       Neuro/Psych    Oriented x3: Yes   Mood/Affect: Normal       Dilation    Both eyes: 1.0% Mydriacyl, 2.5% Phenylephrine @ 9:15 AM        Slit Lamp and Fundus Exam    Slit Lamp Exam      Right Left   Lids/Lashes Dermatochalasis - upper lid, mild Meibomian gland dysfunction Dermatochalasis - upper lid, mild Meibomian gland dysfunction   Conjunctiva/Sclera White and quiet White and quiet   Cornea Arcus, 2+ Punctate epithelial erosions Arcus, 2+ Punctate epithelial erosions   Anterior Chamber Deep and quiet Deep and quiet   Iris Round and dilated Round and dilated   Lens 2-3+ Nuclear sclerosis, 2-3+ Cortical cataract 2-3+ Nuclear sclerosis, 2-3+ Cortical cataract   Vitreous Vitreous syneresis Vitreous syneresis, Posterior vitreous detachment       Fundus Exam      Right Left   Disc Compact, mild tilt, +PPA greatest temporally Compact, +focal PPA IT rim, Pink and Sharp   C/D Ratio 0.1 0.1   Macula Flat, Good foveal reflex, RPE mottling and clumping, No heme or edema Flat, blunted foveal reflex, RPE mottling and clumping, No heme or edema   Vessels Mild Vascular attenuation, mild Tortuousity Mild Vascular attenuation, mild Tortuousity, mild AV crossing changes   Periphery Attached, reticular degeneration, peripheral drusen, peripheral CHRPE spanning [phone] far periphery w/ single punctate lucuna Attached, reticular degeneration, peripheral drusen, paving stone degeneration inferiorly        Refraction    Wearing Rx      Sphere Cylinder Axis Add   Right -2.50 +1.25 090 +2.75   Left -1.75 +1.00 082 +2.75   Age: 42 yrs   Type: PAL       Manifest Refraction      Sphere Cylinder Axis Dist VA   Right -3.00 +1.25 090 20/25-2   Left -2.75 +0.75 083 20/30-2          IMAGING AND PROCEDURES   Imaging and Procedures for @TODAY @  OCT, Retina - OU - Both Eyes       Right Eye Quality was good. Central Foveal Thickness: 243. Progression has no prior data. Findings include normal foveal contour, no IRF, no SRF.   Left Eye Quality was  good. Central Foveal Thickness: 246. Progression has no prior data. Findings include normal foveal contour, no SRF, no IRF.   Notes *Images captured and stored on drive  Diagnosis / Impression:  NFP; no IRF/SRF OU  Clinical management:  See below  Abbreviations: NFP - Normal foveal profile. CME - cystoid macular edema. PED - pigment epithelial detachment. IRF - intraretinal fluid. SRF - subretinal fluid. EZ - ellipsoid zone. ERM - epiretinal membrane. ORA - outer retinal atrophy. ORT - outer retinal tubulation. SRHM - subretinal hyper-reflective material        Color Fundus Photography Optos - OU - Both Eyes       Right Eye Progression has no prior data. Disc findings include normal observations. Macula : normal observations, flat. Vessels : tortuous vessels, attenuated. Periphery : RPE abnormality (CHRPE temporal periphery).   Left Eye Progression has no prior data. Disc findings include normal observations. Macula : flat, normal observations. Vessels : tortuous vessels, attenuated. Periphery : RPE abnormality.   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 Retinal edema  H35.81 OCT, Retina - OU - Both Eyes  3. Essential hypertension  I10   4. Hypertensive retinopathy of both eyes  H35.033   5. Combined forms of age-related cataract of both eyes  H25.813   6. Dry eyes  H04.123     1. CHRPE OD  - nasal periphery - spanning 3-345  - discussed findings and prognosis  - Optos widefield images obtained today -- will send copies  to Dr. Katy Fitch   - monitor  2. No retinal edema on exam or OCT  3,4. Hypertensive retinopathy OU  - discussed importance of tight BP control  - monitor  5. Mixed cataracts OU  - under the expert care of Dr. Zenia Resides  - The symptoms of cataract, surgical options, and treatments and risks were discussed with patient.  - discussed diagnosis and progression  - OS scheduled for surgery with Dr. Chauncey Cruel. [place] (05.28.21)  6. Dry eyes OU  - recommend artificial tears and lubricating ointment as needed    Ophthalmic Meds Ordered this visit:  No orders of the defined types were placed in this encounter.      Return in 1 year (on 11/13/2020), or if symptoms worsen or fail to improve, for f/u CHRPE OD, DFE, OCT.  There are no Patient Instructions on file for this visit.   Explained the diagnoses, plan, and follow up with the patient and they expressed understanding.  Patient expressed understanding of the importance of proper follow up care.   This document serves as a record of services personally performed by Gardiner Sleeper, MD, PhD. It was created on their behalf by Leeann Must, [place], a certified ophthalmic assistant. The creation of this record is the provider's dictation and/or activities during the visit.    Electronically signed by: Leeann Must, COA @TODAY @ 11:43 PM   This document serves as a record of services personally performed by Gardiner Sleeper, MD, PhD. It was created on their behalf by Ernest Mallick, OA, an ophthalmic assistant. The creation of this record is the provider's dictation and/or activities during the visit.    Electronically signed by: Ernest Mallick, OA 05.06.2021 11:43  PM   Gardiner Sleeper, M.D., Ph.D. Diseases & Surgery of the Retina and Vitreous [facility]  I have reviewed the above documentation for accuracy and completeness, and I agree with the above. Gardiner Sleeper, M.D., Ph.D. 11/14/19 11:43 PM    Abbreviations: M myopia  (nearsighted); A astigmatism; H hyperopia (farsighted); P presbyopia; Mrx spectacle prescription;  CTL contact lenses; OD right eye; OS left eye; OU both eyes  XT exotropia; ET esotropia; PEK punctate epithelial keratitis; PEE punctate epithelial erosions; DES dry eye syndrome; MGD meibomian gland dysfunction; ATs artificial tears; PFAT's preservative free artificial tears; [place] nuclear sclerotic cataract; PSC posterior subcapsular cataract; ERM epi-retinal membrane; PVD posterior vitreous detachment; RD retinal detachment; DM diabetes mellitus; DR diabetic retinopathy; NPDR non-proliferative diabetic retinopathy; PDR proliferative diabetic retinopathy; CSME clinically significant macular edema; DME diabetic macular edema; dbh dot blot hemorrhages; CWS cotton wool spot; POAG primary open angle glaucoma; C/D cup-to-disc ratio; HVF humphrey visual field; GVF goldmann visual field; OCT optical coherence tomography; IOP intraocular pressure; BRVO Branch retinal vein occlusion; CRVO central retinal vein occlusion; CRAO central retinal artery occlusion; BRAO branch retinal artery occlusion; RT retinal tear; SB scleral buckle; PPV pars plana vitrectomy; VH Vitreous hemorrhage; PRP panretinal laser photocoagulation; IVK intravitreal kenalog; VMT vitreomacular traction; MH Macular hole;  NVD neovascularization of the disc; NVE neovascularization elsewhere; AREDS age related eye disease study; ARMD age related macular degeneration; POAG primary open angle glaucoma; EBMD epithelial/anterior basement membrane dystrophy; ACIOL anterior chamber intraocular lens; IOL intraocular lens; PCIOL posterior chamber intraocular lens; Phaco/IOL phacoemulsification with intraocular lens placement; [place] photorefractive keratectomy; LASIK laser assisted in situ keratomileusis; HTN hypertension; DM diabetes mellitus; COPD chronic obstructive pulmonary disease

## 2019-11-14 ENCOUNTER — Encounter (INDEPENDENT_AMBULATORY_CARE_PROVIDER_SITE_OTHER): Payer: Self-pay | Admitting: Ophthalmology

## 2019-11-14 ENCOUNTER — Ambulatory Visit (INDEPENDENT_AMBULATORY_CARE_PROVIDER_SITE_OTHER): Payer: Medicare Other | Admitting: Ophthalmology

## 2019-11-14 DIAGNOSIS — H3581 Retinal edema: Secondary | ICD-10-CM | POA: Diagnosis not present

## 2019-11-14 DIAGNOSIS — H04123 Dry eye syndrome of bilateral lacrimal glands: Secondary | ICD-10-CM | POA: Diagnosis not present

## 2019-11-14 DIAGNOSIS — H25813 Combined forms of age-related cataract, bilateral: Secondary | ICD-10-CM

## 2019-11-14 DIAGNOSIS — I10 Essential (primary) hypertension: Secondary | ICD-10-CM

## 2019-11-14 DIAGNOSIS — Q141 Congenital malformation of retina: Secondary | ICD-10-CM

## 2019-11-14 DIAGNOSIS — H35033 Hypertensive retinopathy, bilateral: Secondary | ICD-10-CM

## 2019-11-28 DIAGNOSIS — L309 Dermatitis, unspecified: Secondary | ICD-10-CM | POA: Diagnosis not present

## 2019-12-06 DIAGNOSIS — H25812 Combined forms of age-related cataract, left eye: Secondary | ICD-10-CM | POA: Diagnosis not present

## 2019-12-06 DIAGNOSIS — H2512 Age-related nuclear cataract, left eye: Secondary | ICD-10-CM | POA: Diagnosis not present

## 2019-12-10 ENCOUNTER — Other Ambulatory Visit: Payer: Self-pay

## 2019-12-10 ENCOUNTER — Ambulatory Visit
Admission: RE | Admit: 2019-12-10 | Discharge: 2019-12-10 | Disposition: A | Discharge status: Home / Self Care | Payer: Medicare Other | Source: Ambulatory Visit | Attending: Family Medicine | Admitting: Family Medicine

## 2019-12-10 DIAGNOSIS — E2839 Other primary ovarian failure: Secondary | ICD-10-CM

## 2019-12-10 DIAGNOSIS — Z780 Asymptomatic menopausal state: Secondary | ICD-10-CM | POA: Diagnosis not present

## 2019-12-10 DIAGNOSIS — M8589 Other specified disorders of bone density and structure, multiple sites: Secondary | ICD-10-CM | POA: Diagnosis not present

## 2020-01-10 DIAGNOSIS — H2511 Age-related nuclear cataract, right eye: Secondary | ICD-10-CM | POA: Diagnosis not present

## 2020-01-20 DIAGNOSIS — M810 Age-related osteoporosis without current pathological fracture: Secondary | ICD-10-CM | POA: Diagnosis not present

## 2020-01-23 DIAGNOSIS — E785 Hyperlipidemia, unspecified: Secondary | ICD-10-CM | POA: Diagnosis not present

## 2020-03-03 DIAGNOSIS — L249 Irritant contact dermatitis, unspecified cause: Secondary | ICD-10-CM | POA: Diagnosis not present

## 2020-04-02 DIAGNOSIS — Z23 Encounter for immunization: Secondary | ICD-10-CM | POA: Diagnosis not present

## 2020-04-07 DIAGNOSIS — L821 Other seborrheic keratosis: Secondary | ICD-10-CM | POA: Diagnosis not present

## 2020-04-07 DIAGNOSIS — L309 Dermatitis, unspecified: Secondary | ICD-10-CM | POA: Diagnosis not present

## 2020-04-07 DIAGNOSIS — D229 Melanocytic nevi, unspecified: Secondary | ICD-10-CM | POA: Diagnosis not present

## 2020-04-17 DIAGNOSIS — Z23 Encounter for immunization: Secondary | ICD-10-CM | POA: Diagnosis not present

## 2020-06-09 DIAGNOSIS — L111 Transient acantholytic dermatosis [Grover]: Secondary | ICD-10-CM | POA: Diagnosis not present

## 2020-07-15 DIAGNOSIS — M810 Age-related osteoporosis without current pathological fracture: Secondary | ICD-10-CM | POA: Diagnosis not present

## 2020-07-16 DIAGNOSIS — I8310 Varicose veins of unspecified lower extremity with inflammation: Secondary | ICD-10-CM | POA: Diagnosis not present

## 2020-07-16 DIAGNOSIS — D485 Neoplasm of uncertain behavior of skin: Secondary | ICD-10-CM | POA: Diagnosis not present

## 2020-07-29 DIAGNOSIS — I10 Essential (primary) hypertension: Secondary | ICD-10-CM | POA: Diagnosis not present

## 2020-07-29 DIAGNOSIS — M810 Age-related osteoporosis without current pathological fracture: Secondary | ICD-10-CM | POA: Diagnosis not present

## 2020-07-29 DIAGNOSIS — Z6821 Body mass index (BMI) 21.0-21.9, adult: Secondary | ICD-10-CM | POA: Diagnosis not present

## 2020-07-29 DIAGNOSIS — Z8639 Personal history of other endocrine, nutritional and metabolic disease: Secondary | ICD-10-CM | POA: Diagnosis not present

## 2020-09-01 DIAGNOSIS — L578 Other skin changes due to chronic exposure to nonionizing radiation: Secondary | ICD-10-CM | POA: Diagnosis not present

## 2020-09-01 DIAGNOSIS — L905 Scar conditions and fibrosis of skin: Secondary | ICD-10-CM | POA: Diagnosis not present

## 2020-09-28 DIAGNOSIS — H00022 Hordeolum internum right lower eyelid: Secondary | ICD-10-CM | POA: Diagnosis not present

## 2020-09-29 DIAGNOSIS — L578 Other skin changes due to chronic exposure to nonionizing radiation: Secondary | ICD-10-CM | POA: Diagnosis not present

## 2020-09-29 DIAGNOSIS — L570 Actinic keratosis: Secondary | ICD-10-CM | POA: Diagnosis not present

## 2020-10-29 DIAGNOSIS — E785 Hyperlipidemia, unspecified: Secondary | ICD-10-CM | POA: Diagnosis not present

## 2020-10-29 DIAGNOSIS — Z0000 Encounter for general adult medical examination without abnormal findings: Secondary | ICD-10-CM | POA: Diagnosis not present

## 2020-11-03 DIAGNOSIS — M8580 Other specified disorders of bone density and structure, unspecified site: Secondary | ICD-10-CM | POA: Diagnosis not present

## 2020-11-03 DIAGNOSIS — R030 Elevated blood-pressure reading, without diagnosis of hypertension: Secondary | ICD-10-CM | POA: Diagnosis not present

## 2020-11-03 DIAGNOSIS — E785 Hyperlipidemia, unspecified: Secondary | ICD-10-CM | POA: Diagnosis not present

## 2020-11-03 DIAGNOSIS — Z0000 Encounter for general adult medical examination without abnormal findings: Secondary | ICD-10-CM | POA: Diagnosis not present

## 2020-11-03 DIAGNOSIS — D72820 Lymphocytosis (symptomatic): Secondary | ICD-10-CM | POA: Diagnosis not present

## 2020-11-03 DIAGNOSIS — K219 Gastro-esophageal reflux disease without esophagitis: Secondary | ICD-10-CM | POA: Diagnosis not present

## 2020-11-13 NOTE — Progress Notes (Signed)
[facility] Note  11/16/2020     CHIEF COMPLAINT Patient presents for Retina Follow Up   HISTORY OF PRESENT ILLNESS: Bonnie Cowan is a 80 y.o. female who presents to the clinic today for:   HPI    Retina Follow Up    Patient presents with  Other.  In right eye.  This started 1 year ago.  Since onset it is stable.  I, the attending physician,  performed the HPI with the patient and updated documentation appropriately.          Comments    Pt here for 1 year retinal follow up CHRPE OD. Pt reports she had cataract sx OU, May/July 2021. No vision changes shes concerned about, seeing Dr. Katy Fitch at the end of June. No ocular pain or discomfort.         Last edited by Bernarda Caffey, MD on 11/16/2020  1:35 PM. (History)    pt states she had cataract sx OS with Dr. Zenia Resides and she cannot see up close now, she goes back to see him in June and is going to try and get new glasses  Referring physician: Debbra Riding, MD [address] [place],  [place] [address]  HISTORICAL INFORMATION:   Selected notes from the MEDICAL RECORD NUMBER Referred by Dr. Katy Fitch   CURRENT MEDICATIONS: Current Outpatient Medications (Ophthalmic Drugs)  Medication Sig  . Prednisolon-Gatiflox-Bromfenac 1-0.5-0.075 % SUSP Place 1 drop into the left eye 4 (four) times daily.   No current facility-administered medications for this visit. (Ophthalmic Drugs)   Current Outpatient Medications (Other)  Medication Sig  . amLODipine (NORVASC) 10 MG tablet   . aspirin EC 81 MG tablet Take 1 tablet (81 mg total) by mouth daily.  Marland Kitchen BIOTIN PO Take 1 tablet by mouth daily.  Marland Kitchen CALCIUM PO Take 1 tablet by mouth daily.  . methocarbamol (ROBAXIN) 500 MG tablet Take 1 tablet (500 mg total) by mouth 3 (three) times daily as needed.  . Multiple Vitamin (MULTI VITAMIN DAILY PO) Take 1 tablet by mouth daily.  Marland Kitchen oxyCODONE-acetaminophen (ROXICET) 5-325 MG tablet Take 1-2 tablets by mouth every 4  (four) hours as needed for severe pain.  . pravastatin (PRAVACHOL) 10 MG tablet   . warfarin (COUMADIN) 5 MG tablet Take 1 tablet (5 mg total) by mouth daily. Take as directed per the pharmacist for INR target from 2.5-3.0 for 30 days post op  . Zoster Vaccine Adjuvanted [facility]) injection Shingrix (PF) 50 mcg/0.5 mL intramuscular suspension, kit   No current facility-administered medications for this visit. (Other)      REVIEW OF SYSTEMS: ROS    Positive for: Gastrointestinal, Eyes   Negative for: Constitutional, Neurological, Skin, Genitourinary, Musculoskeletal, HENT, Endocrine, Cardiovascular, Respiratory, Psychiatric, Allergic/Imm, Heme/Lymph   Last edited by Kingsley Spittle, COT on 11/16/2020 10:03 AM. (History)       ALLERGIES No Known Allergies  PAST MEDICAL HISTORY Past Medical History:  Diagnosis Date  . Arthritis   . Cataract    OU  . Cold   . GERD (gastroesophageal reflux disease)   . Meniscus tear    right knee   Past Surgical History:  Procedure Laterality Date  . ABDOMINAL HYSTERECTOMY    . 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 Heart disease Mother     SOCIAL HISTORY Social History   Tobacco Use  . Smoking status: Never Smoker  Substance Use Topics  . Alcohol use: No  . Drug use: No         OPHTHALMIC EXAM:  Base Eye Exam    Visual Acuity (Snellen - Linear)      Right Left   Dist [place] 20/30 -2 20/20 -1   Dist ph  [place] 20/20        Tonometry (Tonopen, 10:17 AM)      Right Left   Pressure 9 9       Pupils      Dark Light Shape React APD   Right 3 2 Round Brisk None   Left 3 2 Round Brisk None       Visual Fields (Counting fingers)      Left Right    Full Full       Extraocular Movement      Right Left    Full, Ortho Full,  Ortho       Neuro/Psych    Oriented x3: Yes   Mood/Affect: Normal       Dilation    Both eyes: 1.0% Mydriacyl, 2.5% Phenylephrine @ 10:18 AM        Slit Lamp and Fundus Exam    Slit Lamp Exam      Right Left   Lids/Lashes Dermatochalasis - upper lid Dermatochalasis - upper lid   Conjunctiva/Sclera White and quiet White and quiet   Cornea Arcus, 2+ fine Punctate epithelial erosions, horizontal iron line inferior midzone, well healed cataract wound Arcus, 2-3+ fine Punctate epithelial erosions, well healed cataract wound   Anterior Chamber Deep and quiet Deep and quiet   Iris Round and dilated Round and dilated   Lens PC IOL in good position, trace Posterior capsular opacification PC IOL in good position, trace Posterior capsular opacification   Vitreous Vitreous syneresis Vitreous syneresis, Posterior vitreous detachment       Fundus Exam      Right Left   Disc Compact, mild tilt, +PPA greatest temporally Compact, +focal PPA IT rim, Pink and Sharp   C/D Ratio 0.1 0.1   Macula Flat, Good foveal reflex, RPE mottling and clumping, No heme or edema Flat, blunted foveal reflex, RPE mottling and clumping, No heme or edema   Vessels attenuated, mild tortuousity, mild AV crossing changes attenuated, mild AV crossing changes   Periphery Attached, reticular degeneration, peripheral drusen, peripheral CHRPE spanning [phone] far periphery w/ single punctate lucuna Attached, reticular degeneration, peripheral drusen, paving stone degeneration inferiorly        Refraction    Wearing Rx      Sphere Cylinder Axis Add   Right -2.50 +1.25 090 +2.75   Left -1.75 +1.00 082 +2.75   Type: PAL       Manifest Refraction      Sphere Cylinder Axis   Right -0.50 +1.25 088   Left -0.50 +1.25 087          IMAGING AND PROCEDURES  Imaging and Procedures for _0 @  OCT, Retina - OU - Both Eyes       Right Eye Quality was good. Central Foveal Thickness: 254. Progression has been stable.  Findings include normal foveal contour, no IRF, no SRF.   Left Eye Quality was good. Central Foveal Thickness: 260. Progression has been stable. Findings include normal foveal contour, no SRF, no IRF.  Notes *Images captured and stored on drive  Diagnosis / Impression:  NFP; no IRF/SRF OU  Clinical management:  See below  Abbreviations: NFP - Normal foveal profile. CME - cystoid macular edema. PED - pigment epithelial detachment. IRF - intraretinal fluid. SRF - subretinal fluid. EZ - ellipsoid zone. ERM - epiretinal membrane. ORA - outer retinal atrophy. ORT - outer retinal tubulation. 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 Retinal edema  H35.81 OCT, Retina - OU - Both Eyes  3. Essential hypertension  I10   4. Hypertensive retinopathy of both eyes  H35.033   5. Pseudophakia, both eyes  Z96.1   6. Dry eyes  H04.123     1. CHRPE OD -- stable  - nasal periphery - spanning 3-345 -- no change from prior  - discussed findings and prognosis  - pt is cleared from a retina standpoint for release to Dr. Zenia Resides and resumption of primary eye care  2. No retinal edema on exam or OCT  3,4. Hypertensive retinopathy OU  - discussed importance of tight BP control  - monitor  5. Pseudophakia OU  - s/p CE/IOL (Dr. Zenia Resides, May/July 2021)  - IOL in good position, doing well  - monitor  6. Dry eyes OU  - recommend artificial tears and lubricating ointment as needed  Ophthalmic Meds Ordered this visit:  No orders of the defined types were placed in this encounter.     Return if symptoms worsen or fail to improve.  There are no Patient Instructions on file for this visit.   Explained the diagnoses, plan, and follow up with the patient and they expressed understanding.  Patient expressed understanding of the importance of proper follow up care.   This  document serves as a record of services personally performed by Gardiner Sleeper, MD, PhD. It was created on their behalf by Leeann Must, [place], a certified ophthalmic assistant. The creation of this record is the provider's dictation and/or activities during the visit.    Electronically signed by: Leeann Must, COA _0 @ 1:45 PM   This document serves as a record of services personally performed by Gardiner Sleeper, MD, PhD. It was created on their behalf by Ernest Mallick, OA, an ophthalmic assistant. The creation of this record is the provider's dictation and/or activities during the visit.    Electronically signed by: Ernest Mallick, OA 05.06.2021 1:45 PM  Gardiner Sleeper, M.D., Ph.D. Diseases & Surgery of the Retina and Vitreous [facility]  I have reviewed the above documentation for accuracy and completeness, and I agree with the above. Gardiner Sleeper, M.D., Ph.D. 11/14/19 1:45 PM  Abbreviations: M myopia (nearsighted); A astigmatism; H hyperopia (farsighted); P presbyopia; Mrx spectacle prescription;  CTL contact lenses; OD right eye; OS left eye; OU both eyes  XT exotropia; ET esotropia; PEK punctate epithelial keratitis; PEE punctate epithelial erosions; DES dry eye syndrome; MGD meibomian gland dysfunction; ATs artificial tears; PFAT's preservative free artificial tears; [place] nuclear sclerotic cataract; PSC posterior subcapsular cataract; ERM epi-retinal membrane; PVD posterior vitreous detachment; RD retinal detachment; DM diabetes mellitus; DR diabetic retinopathy; NPDR non-proliferative diabetic retinopathy; PDR proliferative diabetic retinopathy; CSME clinically significant macular edema; DME diabetic macular edema; dbh dot blot hemorrhages; CWS cotton wool spot; POAG primary open angle glaucoma;  C/D cup-to-disc ratio; HVF humphrey visual field; GVF goldmann visual field; OCT optical coherence tomography; IOP intraocular pressure; BRVO Branch retinal vein  occlusion; CRVO central retinal vein occlusion; CRAO central retinal artery occlusion; BRAO branch retinal artery occlusion; RT retinal tear; SB scleral buckle; PPV pars plana vitrectomy; VH Vitreous hemorrhage; PRP panretinal laser photocoagulation; IVK intravitreal kenalog; VMT vitreomacular traction; MH Macular hole;  NVD neovascularization of the disc; NVE neovascularization elsewhere; AREDS age related eye disease study; ARMD age related macular degeneration; POAG primary open angle glaucoma; EBMD epithelial/anterior basement membrane dystrophy; ACIOL anterior chamber intraocular lens; IOL intraocular lens; PCIOL posterior chamber intraocular lens; Phaco/IOL phacoemulsification with intraocular lens placement; [place] photorefractive keratectomy; LASIK laser assisted in situ keratomileusis; HTN hypertension; DM diabetes mellitus; COPD chronic obstructive pulmonary disease

## 2020-11-16 ENCOUNTER — Other Ambulatory Visit: Payer: Self-pay

## 2020-11-16 ENCOUNTER — Encounter (INDEPENDENT_AMBULATORY_CARE_PROVIDER_SITE_OTHER): Payer: Self-pay | Admitting: Ophthalmology

## 2020-11-16 ENCOUNTER — Ambulatory Visit (INDEPENDENT_AMBULATORY_CARE_PROVIDER_SITE_OTHER): Payer: Medicare Other | Admitting: Ophthalmology

## 2020-11-16 DIAGNOSIS — Z961 Presence of intraocular lens: Secondary | ICD-10-CM | POA: Diagnosis not present

## 2020-11-16 DIAGNOSIS — H3581 Retinal edema: Secondary | ICD-10-CM

## 2020-11-16 DIAGNOSIS — Q141 Congenital malformation of retina: Secondary | ICD-10-CM | POA: Diagnosis not present

## 2020-11-16 DIAGNOSIS — I10 Essential (primary) hypertension: Secondary | ICD-10-CM

## 2020-11-16 DIAGNOSIS — H25813 Combined forms of age-related cataract, bilateral: Secondary | ICD-10-CM

## 2020-11-16 DIAGNOSIS — H35033 Hypertensive retinopathy, bilateral: Secondary | ICD-10-CM

## 2020-11-16 DIAGNOSIS — H04123 Dry eye syndrome of bilateral lacrimal glands: Secondary | ICD-10-CM

## 2021-01-07 DIAGNOSIS — Q141 Congenital malformation of retina: Secondary | ICD-10-CM | POA: Diagnosis not present

## 2021-01-07 DIAGNOSIS — H16223 Keratoconjunctivitis sicca, not specified as Sjogren's, bilateral: Secondary | ICD-10-CM | POA: Diagnosis not present

## 2021-01-07 DIAGNOSIS — Z961 Presence of intraocular lens: Secondary | ICD-10-CM | POA: Diagnosis not present

## 2021-01-21 DIAGNOSIS — I10 Essential (primary) hypertension: Secondary | ICD-10-CM | POA: Diagnosis not present

## 2021-01-21 DIAGNOSIS — Z6821 Body mass index (BMI) 21.0-21.9, adult: Secondary | ICD-10-CM | POA: Diagnosis not present

## 2021-01-21 DIAGNOSIS — M810 Age-related osteoporosis without current pathological fracture: Secondary | ICD-10-CM | POA: Diagnosis not present

## 2021-02-01 DIAGNOSIS — L570 Actinic keratosis: Secondary | ICD-10-CM | POA: Diagnosis not present

## 2021-02-01 DIAGNOSIS — I8393 Asymptomatic varicose veins of bilateral lower extremities: Secondary | ICD-10-CM | POA: Diagnosis not present

## 2021-02-01 DIAGNOSIS — D229 Melanocytic nevi, unspecified: Secondary | ICD-10-CM | POA: Diagnosis not present

## 2021-02-01 DIAGNOSIS — L578 Other skin changes due to chronic exposure to nonionizing radiation: Secondary | ICD-10-CM | POA: Diagnosis not present

## 2021-02-01 DIAGNOSIS — L821 Other seborrheic keratosis: Secondary | ICD-10-CM | POA: Diagnosis not present

## 2021-02-01 DIAGNOSIS — L814 Other melanin hyperpigmentation: Secondary | ICD-10-CM | POA: Diagnosis not present

## 2021-02-19 DIAGNOSIS — Z23 Encounter for immunization: Secondary | ICD-10-CM | POA: Diagnosis not present

## 2021-04-14 DIAGNOSIS — Z23 Encounter for immunization: Secondary | ICD-10-CM | POA: Diagnosis not present

## 2021-05-18 DIAGNOSIS — Z23 Encounter for immunization: Secondary | ICD-10-CM | POA: Diagnosis not present

## 2021-07-28 DIAGNOSIS — M810 Age-related osteoporosis without current pathological fracture: Secondary | ICD-10-CM | POA: Diagnosis not present

## 2021-08-04 DIAGNOSIS — M810 Age-related osteoporosis without current pathological fracture: Secondary | ICD-10-CM | POA: Diagnosis not present

## 2021-08-04 DIAGNOSIS — Z8639 Personal history of other endocrine, nutritional and metabolic disease: Secondary | ICD-10-CM | POA: Diagnosis not present

## 2021-08-04 DIAGNOSIS — Z6821 Body mass index (BMI) 21.0-21.9, adult: Secondary | ICD-10-CM | POA: Diagnosis not present

## 2021-08-04 DIAGNOSIS — I10 Essential (primary) hypertension: Secondary | ICD-10-CM | POA: Diagnosis not present

## 2021-09-29 DIAGNOSIS — L309 Dermatitis, unspecified: Secondary | ICD-10-CM | POA: Diagnosis not present

## 2021-09-29 DIAGNOSIS — I872 Venous insufficiency (chronic) (peripheral): Secondary | ICD-10-CM | POA: Diagnosis not present

## 2021-09-29 DIAGNOSIS — L908 Other atrophic disorders of skin: Secondary | ICD-10-CM | POA: Diagnosis not present

## 2021-11-09 DIAGNOSIS — Z0000 Encounter for general adult medical examination without abnormal findings: Secondary | ICD-10-CM | POA: Diagnosis not present

## 2021-11-09 DIAGNOSIS — D72820 Lymphocytosis (symptomatic): Secondary | ICD-10-CM | POA: Diagnosis not present

## 2021-11-09 DIAGNOSIS — E785 Hyperlipidemia, unspecified: Secondary | ICD-10-CM | POA: Diagnosis not present

## 2021-11-09 DIAGNOSIS — I10 Essential (primary) hypertension: Secondary | ICD-10-CM | POA: Diagnosis not present

## 2021-11-16 DIAGNOSIS — M8580 Other specified disorders of bone density and structure, unspecified site: Secondary | ICD-10-CM | POA: Diagnosis not present

## 2021-11-16 DIAGNOSIS — E785 Hyperlipidemia, unspecified: Secondary | ICD-10-CM | POA: Diagnosis not present

## 2021-11-16 DIAGNOSIS — R030 Elevated blood-pressure reading, without diagnosis of hypertension: Secondary | ICD-10-CM | POA: Diagnosis not present

## 2021-11-16 DIAGNOSIS — K219 Gastro-esophageal reflux disease without esophagitis: Secondary | ICD-10-CM | POA: Diagnosis not present

## 2021-11-16 DIAGNOSIS — Z0000 Encounter for general adult medical examination without abnormal findings: Secondary | ICD-10-CM | POA: Diagnosis not present

## 2021-11-30 DIAGNOSIS — M25561 Pain in right knee: Secondary | ICD-10-CM | POA: Diagnosis not present

## 2021-11-30 DIAGNOSIS — Z96651 Presence of right artificial knee joint: Secondary | ICD-10-CM | POA: Diagnosis not present

## 2022-01-18 DIAGNOSIS — Q141 Congenital malformation of retina: Secondary | ICD-10-CM | POA: Diagnosis not present

## 2022-01-18 DIAGNOSIS — H26493 Other secondary cataract, bilateral: Secondary | ICD-10-CM | POA: Diagnosis not present

## 2022-01-18 DIAGNOSIS — Z961 Presence of intraocular lens: Secondary | ICD-10-CM | POA: Diagnosis not present

## 2022-01-25 DIAGNOSIS — M810 Age-related osteoporosis without current pathological fracture: Secondary | ICD-10-CM | POA: Diagnosis not present

## 2022-03-16 DIAGNOSIS — L111 Transient acantholytic dermatosis [Grover]: Secondary | ICD-10-CM | POA: Diagnosis not present

## 2022-03-16 DIAGNOSIS — L298 Other pruritus: Secondary | ICD-10-CM | POA: Diagnosis not present

## 2022-04-06 DIAGNOSIS — L448 Other specified papulosquamous disorders: Secondary | ICD-10-CM | POA: Diagnosis not present

## 2022-04-06 DIAGNOSIS — L570 Actinic keratosis: Secondary | ICD-10-CM | POA: Diagnosis not present

## 2022-04-06 DIAGNOSIS — L298 Other pruritus: Secondary | ICD-10-CM | POA: Diagnosis not present

## 2022-04-06 DIAGNOSIS — L111 Transient acantholytic dermatosis [Grover]: Secondary | ICD-10-CM | POA: Diagnosis not present

## 2022-04-11 DIAGNOSIS — Z23 Encounter for immunization: Secondary | ICD-10-CM | POA: Diagnosis not present

## 2022-04-21 DIAGNOSIS — Z23 Encounter for immunization: Secondary | ICD-10-CM | POA: Diagnosis not present

## 2022-05-09 ENCOUNTER — Ambulatory Visit (INDEPENDENT_AMBULATORY_CARE_PROVIDER_SITE_OTHER): Payer: Medicare Other | Admitting: Podiatry

## 2022-05-09 ENCOUNTER — Ambulatory Visit (INDEPENDENT_AMBULATORY_CARE_PROVIDER_SITE_OTHER): Payer: Medicare Other

## 2022-05-09 DIAGNOSIS — M722 Plantar fascial fibromatosis: Secondary | ICD-10-CM | POA: Diagnosis not present

## 2022-05-09 NOTE — Patient Instructions (Addendum)
Plantar Fasciitis (Heel Spur Syndrome) with Rehab The plantar fascia is a fibrous, ligament-like, soft-tissue structure that spans the bottom of the foot. Plantar fasciitis is a condition that causes pain in the foot due to inflammation of the tissue. SYMPTOMS   Pain and tenderness on the underneath side of the foot.  Pain that worsens with standing or walking. CAUSES  Plantar fasciitis is caused by irritation and injury to the plantar fascia on the underneath side of the foot. Common mechanisms of injury include:  Direct trauma to bottom of the foot.  Damage to a small nerve that runs under the foot where the main fascia attaches to the heel bone.  Stress placed on the plantar fascia due to bone spurs. RISK INCREASES WITH:   Activities that place stress on the plantar fascia (running, jumping, pivoting, or cutting).  Poor strength and flexibility.  Improperly fitted shoes.  Tight calf muscles.  Flat feet.  Failure to warm-up properly before activity.  Obesity. PREVENTION  Warm up and stretch properly before activity.  Allow for adequate recovery between workouts.  Maintain physical fitness:  Strength, flexibility, and endurance.  Cardiovascular fitness.  Maintain a health body weight.  Avoid stress on the plantar fascia.  Wear properly fitted shoes, including arch supports for individuals who have flat feet.  PROGNOSIS  If treated properly, then the symptoms of plantar fasciitis usually resolve without surgery. However, occasionally surgery is necessary.  RELATED COMPLICATIONS   Recurrent symptoms that may result in a chronic condition.  Problems of the lower back that are caused by compensating for the injury, such as limping.  Pain or weakness of the foot during push-off following surgery.  Chronic inflammation, scarring, and partial or complete fascia tear, occurring more often from repeated injections.  TREATMENT  Treatment initially involves the  use of ice and medication to help reduce pain and inflammation. The use of strengthening and stretching exercises may help reduce pain with activity, especially stretches of the Achilles tendon. These exercises may be performed at home or with a therapist. Your caregiver may recommend that you use heel cups of arch supports to help reduce stress on the plantar fascia. Occasionally, corticosteroid injections are given to reduce inflammation. If symptoms persist for greater than 6 months despite non-surgical (conservative), then surgery may be recommended.   MEDICATION   If pain medication is necessary, then nonsteroidal anti-inflammatory medications, such as aspirin and ibuprofen, or other minor pain relievers, such as acetaminophen, are often recommended.  Do not take pain medication within 7 days before surgery.  Prescription pain relievers may be given if deemed necessary by your caregiver. Use only as directed and only as much as you need.  Corticosteroid injections may be given by your caregiver. These injections should be reserved for the most serious cases, because they may only be given a certain number of times.  HEAT AND COLD  Cold treatment (icing) relieves pain and reduces inflammation. Cold treatment should be applied for 10 to 15 minutes every 2 to 3 hours for inflammation and pain and immediately after any activity that aggravates your symptoms. Use ice packs or massage the area with a piece of ice (ice massage).  Heat treatment may be used prior to performing the stretching and strengthening activities prescribed by your caregiver, physical therapist, or athletic trainer. Use a heat pack or soak the injury in warm water.  SEEK IMMEDIATE MEDICAL CARE IF:  Treatment seems to offer no benefit, or the condition worsens.  Any medications   produce adverse side effects.  EXERCISES- RANGE OF MOTION (ROM) AND STRETCHING EXERCISES - Plantar Fasciitis (Heel Spur Syndrome) These exercises  may help you when beginning to rehabilitate your injury. Your symptoms may resolve with or without further involvement from your physician, physical therapist or athletic trainer. While completing these exercises, remember:   Restoring tissue flexibility helps normal motion to return to the joints. This allows healthier, less painful movement and activity.  An effective stretch should be held for at least 30 seconds.  A stretch should never be painful. You should only feel a gentle lengthening or release in the stretched tissue.  RANGE OF MOTION - Toe Extension, Flexion  Sit with your right / left leg crossed over your opposite knee.  Grasp your toes and gently pull them back toward the top of your foot. You should feel a stretch on the bottom of your toes and/or foot.  Hold this stretch for 10 seconds.  Now, gently pull your toes toward the bottom of your foot. You should feel a stretch on the top of your toes and or foot.  Hold this stretch for 10 seconds. Repeat  times. Complete this stretch 3 times per day.   RANGE OF MOTION - Ankle Dorsiflexion, Active Assisted  Remove shoes and sit on a chair that is preferably not on a carpeted surface.  Place right / left foot under knee. Extend your opposite leg for support.  Keeping your heel down, slide your right / left foot back toward the chair until you feel a stretch at your ankle or calf. If you do not feel a stretch, slide your bottom forward to the edge of the chair, while still keeping your heel down.  Hold this stretch for 10 seconds. Repeat 3 times. Complete this stretch 2 times per day.   STRETCH  Gastroc, Standing  Place hands on wall.  Extend right / left leg, keeping the front knee somewhat bent.  Slightly point your toes inward on your back foot.  Keeping your right / left heel on the floor and your knee straight, shift your weight toward the wall, not allowing your back to arch.  You should feel a gentle stretch  in the right / left calf. Hold this position for 10 seconds. Repeat 3 times. Complete this stretch 2 times per day.  STRETCH  Soleus, Standing  Place hands on wall.  Extend right / left leg, keeping the other knee somewhat bent.  Slightly point your toes inward on your back foot.  Keep your right / left heel on the floor, bend your back knee, and slightly shift your weight over the back leg so that you feel a gentle stretch deep in your back calf.  Hold this position for 10 seconds. Repeat 3 times. Complete this stretch 2 times per day.  STRETCH  Gastrocsoleus, Standing  Note: This exercise can place a lot of stress on your foot and ankle. Please complete this exercise only if specifically instructed by your caregiver.   Place the ball of your right / left foot on a step, keeping your other foot firmly on the same step.  Hold on to the wall or a rail for balance.  Slowly lift your other foot, allowing your body weight to press your heel down over the edge of the step.  You should feel a stretch in your right / left calf.  Hold this position for 10 seconds.  Repeat this exercise with a slight bend in your right /   gain both the endurance and the strength needed for everyday activities through controlled exercises. Complete these exercises as instructed by your physician, physical therapist or athletic trainer. Progress the resistance and repetitions only as guided.  STRENGTH - Towel Curls Sit in a chair positioned on a non-carpeted surface. Place your foot on a towel, keeping your heel on the floor. Pull the towel toward your heel by only curling your toes. Keep your heel on the floor. Repeat 3 times.  Complete this exercise 2 times per day.  STRENGTH - Ankle Inversion Secure one end of a rubber exercise band/tubing to a fixed object (table, pole). Loop the other end around your foot just before your toes. Place your fists between your knees. This will focus your strengthening at your ankle. Slowly, pull your big toe up and in, making sure the band/tubing is positioned to resist the entire motion. Hold this position for 10 seconds. Have your muscles resist the band/tubing as it slowly pulls your foot back to the starting position. Repeat 3 times. Complete this exercises 2 times per day.  Document Released: 06/27/2005 Document Revised: 09/19/2011 Document Reviewed: 10/09/2008 [facility] Patient Information 2014 [place], [place]. Hammer Toe Hammer toe is a change in the shape, or a deformity, of the toe. The deformity causes the middle joint of the toe to stay bent. Hammer toe starts gradually. At first, the toe can be straightened. Then over time, the toe deformity becomes stiff, inflexible, and permanently bent. Hammer toe usually affects the second, third, or fourth toe. A hammer toe causes pain, especially when wearing shoes. Corns and calluses can result from the toe rubbing against the inside of the shoe. Early treatments to keep the toe straight may relieve pain. As the deformity of the toe becomes stiff and permanent, surgery may be needed to straighten the toe. What are the causes? This condition is caused by abnormal bending of the toe joint that is closest to your foot. Over time, the toe bending downward pulls on the muscles and connections (tendons) of the toe joint, making them weak and stiff. Wearing shoes that are too narrow in the toe box and do not allow toes to fully straighten can cause this condition. What increases the risk? You are more likely to develop this condition if you: Are an older female. Wear shoes that are too small, or wear high-heeled shoes that pinch your  toes. Have a second toe that is longer than your big toe (first toe). Injure your foot or toe. Have arthritis, or have a nerve or muscle disorder. Have diabetes or a condition known as Charcot joint, which may cause you to walk abnormally. Have a family history of hammer toe. Are a Engineer, mining. What are the signs or symptoms? Pain and deformity of the toe are the main symptoms of this condition. The pain is worse when wearing shoes, walking, or running. Other symptoms may include: A thickened patch of skin, called a corn or callus, that forms over the top of the bent part of the toe or between the toes. Redness and a burning feeling on the bent toe. An open sore that forms on the top of the bent toe. Not being able to straighten the affected toe. How is this diagnosed? This condition is diagnosed based on your symptoms and a physical exam. During the exam, your health care provider will try to straighten your toe to see how stiff the deformity is. You may also have tests, such as: A blood test  to check for rheumatoid arthritis or diabetes. An X-ray to show how severe the toe deformity is. How is this treated? Treatment for this condition depends on whether the toe is flexible or deformed and no longer moveable. In less severe cases, a hammer toe can be straightened without surgery. These treatments include: Taping the toe into a straightened position. Using pads and cushions to protect the bent toe. Wearing shoes that provide enough room for the toes. Doing toe-stretching exercises at home. Taking an NSAID, such as ibuprofen, to reduce pain and swelling. Using special orthotics or insoles for pain relief and to improve walking. If these treatments do not help or the toe has a severe deformity and cannot be straightened, surgery is the next option. The most common surgeries used to straighten a hammer toe include: Arthroplasty or osteotomy. Part of the toe joint is reconstructed or  removed, which allows the toe to straighten. Fusion. Cartilage between the two bones of the joint is taken out, and the bones are fused together into one longer bone. Implantation. Part of the bone is removed and replaced with an implant to allow the toe to move again. Flexor tendon transfer. The tendons that curl the toes down (flexor tendons) are repositioned. Follow these instructions at home: Take over-the-counter and prescription medicines only as told by your health care provider. Do toe-straightening and stretching exercises as told by your health care provider. Keep all follow-up visits. This is important. How is this prevented? Wear shoes that fit properly and give your toes enough room. Shoes should not cause pain. Buy shoes at the end of the day to make sure they fit well, since your foot may swell during the day. Make sure they are comfortable before you buy them. As you age, your shoe size might change, including the width. Measure both feet and buy shoes for the larger foot. A shoe repair store might be able to stretch shoes that feel tight in spots. Do not wear high-heeled shoes or shoes with pointed toes. Contact a health care provider if: Your pain gets worse. Your toe becomes red or swollen. You develop an open sore on your toe. Summary Hammer toe is a condition that gradually causes your toe to become bent and stiff. Hammer toe can be treated by taping the toe into a straightened position and doing toe-stretching exercises. If these treatments do not help, surgery may be needed. To prevent this condition, wear shoes that fit properly, give your toes enough room, and do not cause pain. This information is not intended to replace advice given to you by your health care provider. Make sure you discuss any questions you have with your health care provider. Document Revised: 10/03/2019 Document Reviewed: 10/03/2019 Elsevier Patient Education  [place].

## 2022-05-09 NOTE — Progress Notes (Signed)
Subjective:   [name], female   DOB: 81 y.o.   MRN: [id number]   HPI Chief Complaint  Patient presents with   Plantar Fasciitis    Right foot heel pain, started a week ago, ache, unable to step down on the right foot, Rate of pain 6 out of 10, patient is not having pain at this time, X-Rays done today,     81 year old female presents with above concerns.  Last week it was hurting but she put ice on it and it helped. She has also taken tylenol as needed occasionally. She is not had pain since. She has a history of plantar and it felt the same as it did before. No swelling or bruising.    Review of Systems  All other systems reviewed and are negative.  Past Medical History:  Diagnosis Date   Arthritis    Cataract    OU   Cold    GERD (gastroesophageal reflux disease)    Meniscus tear    right knee    Past Surgical History:  Procedure Laterality Date   ABDOMINAL HYSTERECTOMY     TOTAL KNEE ARTHROPLASTY Right 09/25/2015   Procedure: RIGHT TOTAL KNEE ARTHROPLASTY;  Surgeon: Netta Cedars, MD;  Location: [facility];  Service: Orthopedics;  Laterality: Right;     Current Outpatient Medications:    amLODipine (NORVASC) 10 MG tablet, , Disp: , Rfl:    aspirin EC 81 MG tablet, Take 1 tablet (81 mg total) by mouth daily., Disp: , Rfl:    BIOTIN PO, Take 1 tablet by mouth daily., Disp: , Rfl:    CALCIUM PO, Take 1 tablet by mouth daily., Disp: , Rfl:    methocarbamol (ROBAXIN) 500 MG tablet, Take 1 tablet (500 mg total) by mouth 3 (three) times daily as needed., Disp: 60 tablet, Rfl: 1   Multiple Vitamin (MULTI VITAMIN DAILY PO), Take 1 tablet by mouth daily., Disp: , Rfl:    pravastatin (PRAVACHOL) 10 MG tablet, , Disp: , Rfl:    Prednisolon-Gatiflox-Bromfenac 1-0.5-0.075 % SUSP, Place 1 drop into the left eye 4 (four) times daily., Disp: , Rfl:    Zoster Vaccine Adjuvanted (SHINGRIX) injection, Shingrix (PF) 50 mcg/0.5 mL intramuscular suspension, kit, Disp: , Rfl:   No  Known Allergies        Objective:  Physical Exam  General: AAO x3, NAD  Dermatological: Skin is warm, dry and supple bilateral. There are no open sores, no preulcerative lesions, no rash or signs of infection present.  Vascular: Dorsalis Pedis artery and Posterior Tibial artery pedal pulses are 2/4 bilateral with immedate capillary fill time.There is no pain with calf compression, swelling, warmth, erythema.   Neruologic: Grossly intact via light touch bilateral.  Negative Tinel sign.  Musculoskeletal: Not able to appreciate any area pinpoint tenderness noted today.  Subjectively she was can tenderness to palpation at insertion of the plantar fascia.  There is no pain with lateral compression of calcaneus.  No edema, erythema.  MMT is also 5.      Assessment:   Right heel pain, plantar fasciitis    Plan:  -Treatment options discussed including all alternatives, risks, and complications -Etiology of symptoms were discussed -X-rays were obtained and reviewed with the patient.  Small inferior calcaneal spur present.  No evidence of acute fracture. -We discussed stretching, icing a regular basis and continue shoes and good arch support.  She can use topical medication such as Voltaren gel or anti-inflammatories as needed.  Trula Slade DPM

## 2022-07-28 DIAGNOSIS — M810 Age-related osteoporosis without current pathological fracture: Secondary | ICD-10-CM | POA: Diagnosis not present

## 2022-08-04 DIAGNOSIS — M810 Age-related osteoporosis without current pathological fracture: Secondary | ICD-10-CM | POA: Diagnosis not present

## 2022-08-04 DIAGNOSIS — Z8639 Personal history of other endocrine, nutritional and metabolic disease: Secondary | ICD-10-CM | POA: Diagnosis not present

## 2022-08-08 DIAGNOSIS — L111 Transient acantholytic dermatosis [Grover]: Secondary | ICD-10-CM | POA: Diagnosis not present

## 2022-11-21 DIAGNOSIS — M8580 Other specified disorders of bone density and structure, unspecified site: Secondary | ICD-10-CM | POA: Diagnosis not present

## 2022-11-21 DIAGNOSIS — Z0000 Encounter for general adult medical examination without abnormal findings: Secondary | ICD-10-CM | POA: Diagnosis not present

## 2022-11-21 DIAGNOSIS — K219 Gastro-esophageal reflux disease without esophagitis: Secondary | ICD-10-CM | POA: Diagnosis not present

## 2022-11-21 DIAGNOSIS — E785 Hyperlipidemia, unspecified: Secondary | ICD-10-CM | POA: Diagnosis not present

## 2022-11-21 DIAGNOSIS — R2689 Other abnormalities of gait and mobility: Secondary | ICD-10-CM | POA: Diagnosis not present

## 2022-11-21 DIAGNOSIS — R030 Elevated blood-pressure reading, without diagnosis of hypertension: Secondary | ICD-10-CM | POA: Diagnosis not present

## 2022-12-12 ENCOUNTER — Ambulatory Visit: Payer: Medicare Other | Attending: Family Medicine

## 2022-12-12 DIAGNOSIS — M6281 Muscle weakness (generalized): Secondary | ICD-10-CM | POA: Diagnosis not present

## 2022-12-12 DIAGNOSIS — R2681 Unsteadiness on feet: Secondary | ICD-10-CM | POA: Insufficient documentation

## 2022-12-12 DIAGNOSIS — R2689 Other abnormalities of gait and mobility: Secondary | ICD-10-CM | POA: Diagnosis not present

## 2022-12-12 NOTE — Therapy (Signed)
OUTPATIENT PHYSICAL THERAPY LOWER EXTREMITY EVALUATION   Patient Name: Bonnie Cowan MRN: [id number] DOB:August 23, 1940, 81 y.o., female Today's Date: 12/12/2022  END OF SESSION:  PT End of Session - 12/12/22 1611     Visit Number 1    Date for PT Re-Evaluation 02/06/23    Authorization Type Medicare    PT Start Time 1615    PT Stop Time 1700    PT Time Calculation (min) 45 min    Activity Tolerance Patient tolerated treatment well    Behavior During Therapy WFL for tasks assessed/performed             Past Medical History:  Diagnosis Date   Arthritis    Cataract    OU   Cold    GERD (gastroesophageal reflux disease)    Meniscus tear    right knee   Past Surgical History:  Procedure Laterality Date   ABDOMINAL HYSTERECTOMY     TOTAL KNEE ARTHROPLASTY Right 09/25/2015   Procedure: RIGHT TOTAL KNEE ARTHROPLASTY;  Surgeon: Beverely Low, MD;  Location: [facility];  Service: Orthopedics;  Laterality: Right;   Patient Active Problem List   Diagnosis Date Noted   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 I like to work in the yard but I do not feel safe out there. It has progressively gotten worse.   PERTINENT HISTORY: R TKA 2017  PAIN:  Are you having pain? No  PRECAUTIONS: None  WEIGHT BEARING RESTRICTIONS: No  FALLS:  Has patient fallen in last 6 months? No  LIVING ENVIRONMENT: Lives with: lives with their spouse Lives in: House/apartment Stairs: Yes: Internal: 15 steps; on right going up Has following equipment at home: None  OCCUPATION:  Retired  PLOF: Independent  PATIENT GOALS: be more steady and feel safe in the yard   OBJECTIVE:   COGNITION: Overall cognitive status: Within functional limits for tasks assessed     SENSATION: WFL  POSTURE: rounded shoulders  LOWER EXTREMITY ROM: grossly WFL    LOWER EXTREMITY MMT: grossly 4/5    FUNCTIONAL TESTS:  5 times sit to stand: 22.04s Timed up and go (TUG): 12.25s Berg Balance Scale: 48/56   TODAY'S TREATMENT:                                                                                                                              DATE: 12/12/22- EVAL     PATIENT EDUCATION:  Education details: POC and HEP Person educated: Patient Education method: Explanation Education comprehension: verbalized understanding  HOME EXERCISE PROGRAM: Access  Code: [email or web address] URL: [email or web address] Date: 12/12/2022 Prepared by: Cassie Freer  Exercises - Single Leg Stance  - 1 x daily - 7 x weekly - 2 sets - 10 hold - Tandem Stance  - 1 x daily - 7 x weekly - 2 sets - 30 hold - Sit to Stand  - 1 x daily - 7 x weekly - 2 sets - 10 reps - Seated Hip Abduction with Resistance  - 1 x daily - 7 x weekly - 2 sets - 10 reps - Seated Knee Extension with Resistance  - 1 x daily - 7 x weekly - 2 sets - 10 reps - Seated Knee Lifts with Resistance  - 1 x daily - 7 x weekly - 2 sets - 10 reps  ASSESSMENT:  CLINICAL IMPRESSION: Patient is a 82 y.o. female who was seen today for physical therapy evaluation and treatment for gait and balance. She reports she feels unsteady when walking especially when she in the yard.  She demonstrates good overall ROM, strength, and balance. She presents as a low fall risk. She would like to work on her balance as she is afraid of falling in the future especially when out in the yard. Patient will benefit from skilled PT to address her fears and work on some strength and balance training to decrease her risks for falls and feel more safe  when out in her yard.   OBJECTIVE IMPAIRMENTS: decreased balance and decreased coordination.   REHAB POTENTIAL: Good  CLINICAL DECISION MAKING: Stable/uncomplicated  EVALUATION COMPLEXITY: Low   GOALS: Goals reviewed with patient? Yes  SHORT TERM GOALS: Target date: 01/09/23  Patient will be independent with initial HEP. Goal status: INITIAL   LONG TERM GOALS: Target date: 02/06/23  Patient will be independent with advanced/ongoing HEP to improve outcomes and carryover.  Goal status: INITIAL  2.  Patient will be able to do SLS on foam surface for more than 10s  Baseline: 6s  Goal status: INITIAL  3.  Patient will demonstrate improved functional LE strength as demonstrated by 5xSTS < 15s without UE push off. Baseline: 22.04s Goal status: INITIAL  4.  Patient will score 56 on Berg Balance test to demonstrate lower risk of falls. (MCID= 8 points) .  Baseline: 48 Goal status: INITIAL   PLAN:  PT FREQUENCY: 1x/week  PT DURATION: 8 weeks  PLANNED INTERVENTIONS: Therapeutic exercises, Therapeutic activity, Neuromuscular re-education, Balance training, Gait training, Patient/Family education, Self Care, Joint mobilization, Stair training, Cryotherapy, Moist heat, and Manual therapy  PLAN FOR NEXT SESSION: start gym activities, work on [employer], PT 12/12/2022, 4:57 PM

## 2022-12-19 ENCOUNTER — Encounter: Payer: Self-pay | Admitting: Physical Therapy

## 2022-12-19 ENCOUNTER — Ambulatory Visit: Payer: Medicare Other | Admitting: Physical Therapy

## 2022-12-19 DIAGNOSIS — R2681 Unsteadiness on feet: Secondary | ICD-10-CM

## 2022-12-19 DIAGNOSIS — M6281 Muscle weakness (generalized): Secondary | ICD-10-CM

## 2022-12-19 DIAGNOSIS — R2689 Other abnormalities of gait and mobility: Secondary | ICD-10-CM | POA: Diagnosis not present

## 2022-12-19 NOTE — Therapy (Signed)
OUTPATIENT PHYSICAL THERAPY LOWER EXTREMITY TREATMENT   Patient Name: Bonnie Cowan MRN: [id number] DOB:1940/09/11, 82 y.o., female Today's Date: 12/19/2022  END OF SESSION:  PT End of Session - 12/19/22 1518     Visit Number 2    Date for PT Re-Evaluation 02/06/23    PT Start Time 1518    PT Stop Time 1600    PT Time Calculation (min) 42 min    Activity Tolerance Patient tolerated treatment well    Behavior During Therapy WFL for tasks assessed/performed             Past Medical History:  Diagnosis Date   Arthritis    Cataract    OU   Cold    GERD (gastroesophageal reflux disease)    Meniscus tear    right knee   Past Surgical History:  Procedure Laterality Date   ABDOMINAL HYSTERECTOMY     TOTAL KNEE ARTHROPLASTY Right 09/25/2015   Procedure: RIGHT TOTAL KNEE ARTHROPLASTY;  Surgeon: Beverely Low, MD;  Location: [facility];  Service: Orthopedics;  Laterality: Right;   Patient Active Problem List   Diagnosis Date Noted   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 Don't feel eel as easy on her.  PERTINENT HISTORY: R TKA 2017  PAIN:  Are you having pain? No  PRECAUTIONS: None  WEIGHT BEARING RESTRICTIONS: No  FALLS:  Has patient fallen in last 6 months? No  LIVING ENVIRONMENT: Lives with: lives with their spouse Lives in: House/apartment Stairs: Yes: Internal: 15 steps; on right going up Has following equipment at home: None  OCCUPATION: Retired  PLOF: Independent  PATIENT GOALS: be more steady and feel safe in the  yard   OBJECTIVE:   COGNITION: Overall cognitive status: Within functional limits for tasks assessed     SENSATION: WFL  POSTURE: rounded shoulders  LOWER EXTREMITY ROM: grossly WFL    LOWER EXTREMITY MMT: grossly 4/5    FUNCTIONAL TESTS:  5 times sit to stand: 22.04s Timed up and go (TUG): 12.25s Berg Balance Scale: 48/56   TODAY'S TREATMENT:                                                                                                                              DATE:  12/19/22 NuStep L4 x 6 min S2S holding red ball 2x10 LAQ 2lb 2x10  Sanding march 2lb 2x10 some posterior LOB HS curls green 2x12 Sides steps over WaTE bar  Alt 4 in box taps x10 then from airex x10 Heel raises  12/12/22- EVAL     PATIENT EDUCATION:  Education details: POC and HEP Person educated: Patient Education method: Explanation Education comprehension: verbalized understanding  HOME EXERCISE PROGRAM: Access Code: [email or web address] URL: [email or web address] Date: 12/12/2022 Prepared by: Cassie Freer  Exercises - Single Leg Stance  - 1 x daily - 7 x weekly - 2 sets - 10 hold - Tandem Stance  - 1 x daily - 7 x weekly - 2 sets - 30 hold - Sit to Stand  - 1 x daily - 7 x weekly - 2 sets - 10 reps - Seated Hip Abduction with Resistance  - 1 x daily - 7 x weekly - 2 sets - 10 reps - Seated Knee Extension with Resistance  - 1 x daily - 7 x weekly - 2 sets - 10 reps - Seated Knee Lifts with Resistance  - 1 x daily - 7 x weekly - 2 sets - 10 reps  ASSESSMENT:  CLINICAL IMPRESSION: Patient is a 82 y.o. female who was seen today for physical therapy treatment for gait and balance. She reports she feels unsteady when walking especially when she in the yard.  Pt tolerated ain initial progression to TE well evident by no subjective reports of increase pain. Pt had some instability with standing marches putting too much weight in hells losing balance posteriorly. Pt voiced some uncertainty  with noncompliant surfaces but did well. Cue to increase lateral step length with side steps. Patient will benefit from skilled PT to address her fears and work on some strength and balance training to decrease her risks for falls and feel more safe when out in her yard.   OBJECTIVE IMPAIRMENTS: decreased balance and decreased coordination.   REHAB POTENTIAL: Good  CLINICAL DECISION MAKING: Stable/uncomplicated  EVALUATION COMPLEXITY: Low   GOALS: Goals reviewed with patient? Yes  SHORT TERM GOALS: Target date: 01/09/23  Patient will be independent with initial HEP. Goal status: INITIAL   LONG TERM GOALS: Target date: 02/06/23  Patient will be independent with advanced/ongoing HEP to improve outcomes and carryover.  Goal status: INITIAL  2.  Patient will be able to do SLS on foam surface for more than 10s  Baseline: 6s  Goal status: INITIAL  3.  Patient will demonstrate improved functional LE strength as demonstrated by 5xSTS < 15s without UE push off. Baseline: 22.04s Goal status: INITIAL  4.  Patient will score 56 on Berg Balance test to demonstrate lower risk of falls. (MCID= 8 points) .  Baseline: 48 Goal status: INITIAL   PLAN:  PT FREQUENCY: 1x/week  PT DURATION: 8 weeks  PLANNED INTERVENTIONS: Therapeutic exercises, Therapeutic activity, Neuromuscular re-education, Balance training, Gait training, Patient/Family education, Self Care, Joint mobilization, Stair training, Cryotherapy, Moist heat, and Manual therapy  PLAN FOR NEXT SESSION: start gym activities, work on balance    Grayce Sessions, PTA 12/19/2022, 3:18 PM

## 2022-12-27 NOTE — Therapy (Signed)
OUTPATIENT PHYSICAL THERAPY LOWER EXTREMITY TREATMENT   Patient Name: Bonnie Cowan MRN: [id number] DOB:1940-12-15, 82 y.o., female Today's Date: 12/27/2022  END OF SESSION:    Past Medical History:  Diagnosis Date   Arthritis    Cataract    OU   Cold    GERD (gastroesophageal reflux disease)    Meniscus tear    right knee   Past Surgical History:  Procedure Laterality Date   ABDOMINAL HYSTERECTOMY     TOTAL KNEE ARTHROPLASTY Right 09/25/2015   Procedure: RIGHT TOTAL KNEE ARTHROPLASTY;  Surgeon: Beverely Low, MD;  Location: [facility];  Service: Orthopedics;  Laterality: Right;   Patient Active Problem List   Diagnosis Date Noted   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  Rationale for Evaluation and Treatment: Rehabilitation  ONSET DATE: 11/28/22  SUBJECTIVE:   SUBJECTIVE STATEMENT: I feel pretty good.   PERTINENT HISTORY: R TKA 2017  PAIN:  Are you having pain? No  PRECAUTIONS: None  WEIGHT BEARING RESTRICTIONS: No  FALLS:  Has patient fallen in last 6 months? No  LIVING ENVIRONMENT: Lives with: lives with their spouse Lives in: House/apartment Stairs: Yes: Internal: 15 steps; on right going up Has following equipment at home: None  OCCUPATION: Retired  PLOF: Independent  PATIENT GOALS: be more steady and feel safe in the yard   OBJECTIVE:   COGNITION: Overall cognitive status: Within functional limits for tasks assessed     SENSATION: WFL  POSTURE: rounded shoulders  LOWER EXTREMITY ROM: grossly WFL    LOWER EXTREMITY MMT: grossly 4/5    FUNCTIONAL TESTS:  5 times sit to stand: 22.04s Timed up and go (TUG): 12.25s Berg Balance Scale: 48/56   TODAY'S TREATMENT:                                                                                                                               DATE:  12/28/22 Bike L2 x77mins STS on airex 2x10 Step up on airex CGA LAQ 3# 2x12 HS curls green 2x12 Hip abd green band seated 2x10 Ball squeezes 2x10 Calf raises 2x10  Walking on beam   12/19/22 NuStep L4 x 6 min S2S holding red ball 2x10 LAQ 2lb 2x10  Sanding march 2lb 2x10 some posterior LOB HS curls green 2x12 Sides steps over WaTE bar  Alt 4 in box taps x10 then from airex x10 Heel raises    12/12/22- EVAL     PATIENT EDUCATION:  Education details: POC and HEP Person educated: Patient Education method: Explanation Education comprehension: verbalized understanding  HOME EXERCISE PROGRAM: Access Code: [email or web address] URL: [email or web address] Date: 12/12/2022 Prepared by: Cassie Freer  Exercises - Single Leg Stance  - 1 x daily - 7 x weekly - 2 sets -  10 hold - Tandem Stance  - 1 x daily - 7 x weekly - 2 sets - 30 hold - Sit to Stand  - 1 x daily - 7 x weekly - 2 sets - 10 reps - Seated Hip Abduction with Resistance  - 1 x daily - 7 x weekly - 2 sets - 10 reps - Seated Knee Extension with Resistance  - 1 x daily - 7 x weekly - 2 sets - 10 reps - Seated Knee Lifts with Resistance  - 1 x daily - 7 x weekly - 2 sets - 10 reps  ASSESSMENT:  CLINICAL IMPRESSION: Patient is a 82 y.o. female who was seen today for physical therapy treatment for gait and balance. She tolerated ain initial progression to TE well. Pt was a bit uncertain with noncompliant surfaces but did well doing step ups on it. Patient will benefit from skilled PT to address her fears and work on some strength and balance training to decrease her risks for falls and feel more safe when out in her yard.   OBJECTIVE IMPAIRMENTS: decreased balance and decreased coordination.   REHAB POTENTIAL: Good  CLINICAL DECISION MAKING: Stable/uncomplicated  EVALUATION COMPLEXITY: Low   GOALS: Goals reviewed with patient? Yes  SHORT TERM GOALS: Target  date: 01/09/23  Patient will be independent with initial HEP. Goal status: INITIAL   LONG TERM GOALS: Target date: 02/06/23  Patient will be independent with advanced/ongoing HEP to improve outcomes and carryover.  Goal status: INITIAL  2.  Patient will be able to do SLS on foam surface for more than 10s  Baseline: 6s  Goal status: INITIAL  3.  Patient will demonstrate improved functional LE strength as demonstrated by 5xSTS < 15s without UE push off. Baseline: 22.04s Goal status: INITIAL  4.  Patient will score 56 on Berg Balance test to demonstrate lower risk of falls. (MCID= 8 points) .  Baseline: 48 Goal status: INITIAL   PLAN:  PT FREQUENCY: 1x/week  PT DURATION: 8 weeks  PLANNED INTERVENTIONS: Therapeutic exercises, Therapeutic activity, Neuromuscular re-education, Balance training, Gait training, Patient/Family education, Self Care, Joint mobilization, Stair training, Cryotherapy, Moist heat, and Manual therapy  PLAN FOR NEXT SESSION: start gym activities, work on [employer], PT 12/27/2022, 4:35 PM

## 2022-12-28 ENCOUNTER — Ambulatory Visit: Payer: Medicare Other

## 2022-12-28 DIAGNOSIS — M6281 Muscle weakness (generalized): Secondary | ICD-10-CM | POA: Diagnosis not present

## 2022-12-28 DIAGNOSIS — R2689 Other abnormalities of gait and mobility: Secondary | ICD-10-CM | POA: Diagnosis not present

## 2022-12-28 DIAGNOSIS — R2681 Unsteadiness on feet: Secondary | ICD-10-CM

## 2023-01-04 ENCOUNTER — Ambulatory Visit: Payer: Medicare Other | Admitting: Physical Therapy

## 2023-01-04 ENCOUNTER — Encounter: Payer: Self-pay | Admitting: Physical Therapy

## 2023-01-04 DIAGNOSIS — M6281 Muscle weakness (generalized): Secondary | ICD-10-CM

## 2023-01-04 DIAGNOSIS — R2689 Other abnormalities of gait and mobility: Secondary | ICD-10-CM

## 2023-01-04 DIAGNOSIS — R2681 Unsteadiness on feet: Secondary | ICD-10-CM

## 2023-01-04 NOTE — Therapy (Signed)
OUTPATIENT PHYSICAL THERAPY LOWER EXTREMITY TREATMENT   Patient Name: Bonnie Cowan MRN: [id number] DOB:[birth year], 82 y.o., female Today's Date: 01/04/2023  END OF SESSION:  PT End of Session - 01/04/23 1520     Visit Number 4    Date for PT Re-Evaluation 02/06/23    PT Start Time 0320    PT Stop Time 0400    PT Time Calculation (min) 40 min    Activity Tolerance Patient tolerated treatment well    Behavior During Therapy WFL for tasks assessed/performed              Past Medical History:  Diagnosis Date   Arthritis    Cataract    OU   Cold    GERD (gastroesophageal reflux disease)    Meniscus tear    right knee   Past Surgical History:  Procedure Laterality Date   ABDOMINAL HYSTERECTOMY     TOTAL KNEE ARTHROPLASTY Right 09/25/2015   Procedure: RIGHT TOTAL KNEE ARTHROPLASTY;  Surgeon: Beverely Low, MD;  Location: [facility];  Service: Orthopedics;  Laterality: Right;   Patient Active Problem List   Diagnosis Date Noted   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 No  PRECAUTIONS: None  WEIGHT BEARING RESTRICTIONS: No  FALLS:  Has patient fallen in last 6 months? No  LIVING ENVIRONMENT: Lives with: lives with their spouse Lives in: House/apartment Stairs: Yes: Internal: 15 steps; on right going up Has following equipment at home: None  OCCUPATION: Retired  PLOF: Independent  PATIENT GOALS: be more steady and feel safe in the yard   OBJECTIVE:   COGNITION: Overall cognitive status:  Within functional limits for tasks assessed     SENSATION: WFL  POSTURE: rounded shoulders  LOWER EXTREMITY ROM: grossly WFL    LOWER EXTREMITY MMT: grossly 4/5    FUNCTIONAL TESTS:  5 times sit to stand: 22.04s Timed up and go (TUG): 12.25s Berg Balance Scale: 48/56   TODAY'S TREATMENT:                                                                                                                              DATE:  01/04/23 Nustep L5 x Black bar DF/PF 2x10 STS on airex 2x10 SLS L to R hold for 10 seconds x3 SLS alternating cone taps x20 SLS L to R on hod for 5 seconds x3  Resisted gait 20lb all directions x 3 Step ups airex pad to 6 in step x10 Side to side steps  airex pad x10   12/28/22 Bike L2 x104mins STS on airex 2x10 Step up on airex CGA LAQ 3# 2x12 HS curls green 2x12 Hip abd green band seated 2x10 Ball squeezes 2x10 Calf raises 2x10  Walking on beam   12/19/22 NuStep L4 x 6 min S2S holding red ball 2x10 LAQ 2lb 2x10  Sanding march 2lb 2x10 some posterior LOB HS curls green 2x12 Sides steps over WaTE bar  Alt 4 in box taps x10 then from airex x10 Heel raises    12/12/22- EVAL     PATIENT EDUCATION:  Education details: POC and HEP Person educated: Patient Education method: Explanation Education comprehension: verbalized understanding  HOME EXERCISE PROGRAM: Access Code: [email or web address] URL: [email or web address] Date: 12/12/2022 Prepared by: Cassie Freer  Exercises - Single Leg Stance  - 1 x daily - 7 x weekly - 2 sets - 10 hold - Tandem Stance  - 1 x daily - 7 x weekly - 2 sets - 30 hold - Sit to Stand  - 1 x daily - 7 x weekly - 2 sets - 10 reps - Seated Hip Abduction with Resistance  - 1 x daily - 7 x weekly - 2 sets - 10 reps - Seated Knee Extension with Resistance  - 1 x daily - 7 x weekly - 2 sets - 10 reps - Seated Knee Lifts with Resistance  - 1 x daily - 7 x weekly - 2 sets - 10 reps  ASSESSMENT:  CLINICAL  IMPRESSION: The patient is making progress toward goals 1 (I with HEP) and goal 2 (SLS on foam surface for more than 10s). She wants to improve her balance so the treatment focused on balance and gait. Tactiel cues needed to control eccentric phase of resisted gait. Patient tolerated treatment well. She would benefit from continued PT to improve balance, gait, and return to functional activities.  OBJECTIVE IMPAIRMENTS: decreased balance and decreased coordination.   REHAB POTENTIAL: Good  CLINICAL DECISION MAKING: Stable/uncomplicated  EVALUATION COMPLEXITY: Low   GOALS: Goals reviewed with patient? Yes  SHORT TERM GOALS: Target date: 01/09/23  Patient will be independent with initial HEP. Goal status: INITIAL   LONG TERM GOALS: Target date: 02/06/23  Patient will be independent with advanced/ongoing HEP to improve outcomes and carryover.  Goal status: INITIAL  2.  Patient will be able to do SLS on foam surface for more than 10s  Baseline: 6s  Goal status: INITIAL  3.  Patient will demonstrate improved functional LE strength as demonstrated by 5xSTS < 15s without UE push off. Baseline: 22.04s Goal status: INITIAL  4.  Patient will score 56 on Berg Balance test to demonstrate lower risk of falls. (MCID= 8 points) .  Baseline: 48 Goal status: INITIAL   PLAN:  PT FREQUENCY: 1x/week  PT DURATION: 8 weeks  PLANNED INTERVENTIONS: Therapeutic exercises, Therapeutic activity, Neuromuscular re-education, Balance training, Gait training, Patient/Family education, Self Care, Joint mobilization, Stair training, Cryotherapy, Moist heat, and Manual therapy  PLAN FOR NEXT SESSION: work on balance and gait   George Ina, SPTA 01/04/2023, 3:21 PM

## 2023-01-18 ENCOUNTER — Ambulatory Visit: Payer: Medicare Other | Attending: Family Medicine | Admitting: Physical Therapy

## 2023-01-18 DIAGNOSIS — R2689 Other abnormalities of gait and mobility: Secondary | ICD-10-CM | POA: Diagnosis not present

## 2023-01-18 DIAGNOSIS — M6281 Muscle weakness (generalized): Secondary | ICD-10-CM | POA: Insufficient documentation

## 2023-01-18 DIAGNOSIS — R2681 Unsteadiness on feet: Secondary | ICD-10-CM | POA: Insufficient documentation

## 2023-01-18 NOTE — Therapy (Signed)
OUTPATIENT PHYSICAL THERAPY LOWER EXTREMITY TREATMENT   Patient Name: Bonnie Cowan MRN: [id number] DOB:[birth year], 82 y.o., female Today's Date: 01/18/2023  END OF SESSION:  PT End of Session - 01/18/23 1513     Visit Number 5    Date for PT Re-Evaluation 02/06/23    PT Start Time 1515    PT Stop Time 1600    PT Time Calculation (min) 45 min    Activity Tolerance Patient tolerated treatment well    Behavior During Therapy WFL for tasks assessed/performed              Past Medical History:  Diagnosis Date   Arthritis    Cataract    OU   Cold    GERD (gastroesophageal reflux disease)    Meniscus tear    right knee   Past Surgical History:  Procedure Laterality Date   ABDOMINAL HYSTERECTOMY     TOTAL KNEE ARTHROPLASTY Right 09/25/2015   Procedure: RIGHT TOTAL KNEE ARTHROPLASTY;  Surgeon: Beverely Low, MD;  Location: [facility] OR;  Service: Orthopedics;  Laterality: Right;   Patient Active Problem List   Diagnosis Date Noted   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 No  PRECAUTIONS: None  WEIGHT BEARING RESTRICTIONS: No  FALLS:  Has patient fallen in last 6 months? No  LIVING ENVIRONMENT: Lives with: lives with their spouse Lives in: House/apartment Stairs: Yes: Internal: 15 steps; on right going up Has following equipment at home: None  OCCUPATION: Retired  PLOF: Independent  PATIENT GOALS: be more steady and feel safe in the yard   OBJECTIVE:   COGNITION: Overall cognitive  status: Within functional limits for tasks assessed     SENSATION: WFL  POSTURE: rounded shoulders  LOWER EXTREMITY ROM: grossly WFL    LOWER EXTREMITY MMT: grossly 4/5    FUNCTIONAL TESTS:  5 times sit to stand: 22.04s Timed up and go (TUG): 12.25s Berg Balance Scale: 48/56   TODAY'S TREATMENT:                                                                                                                              DATE:   01/18/23 Nustep L5 x 6 mins HS curls 20# 2x10 Knee ext 5# 2x10 Standing marches B 3lb 2x10 Heel taps 4 in x10 Step ups 4 in with R x10; L x10 DLS on airex with eyes closed in different directions with nudges x3each way SLS with L and R 10 sec x3 Tandem stance 10  sec x 3 each L and R; min LOB Tandem walking in // bars forward/backward x3; on balance beam x3 Step ups airex pad to 6 in with R 2x10; L 2x10; min LOB Standing PF stretch on slant board   01/04/23 Nustep L5 x Black bar DF/PF 2x10 STS on airex 2x10 SLS L to R hold for 10 seconds x3 SLS alternating cone taps x20 SLS L to R on hod for 5 seconds x3  Resisted gait 20lb all directions x 3 Step ups airex pad to 6 in step x10 Side to side steps airex pad x10   12/28/22 Bike L2 x58mins STS on airex 2x10 Step up on airex CGA LAQ 3# 2x12 HS curls green 2x12 Hip abd green band seated 2x10 Ball squeezes 2x10 Calf raises 2x10  Walking on beam   12/19/22 NuStep L4 x 6 min S2S holding red ball 2x10 LAQ 2lb 2x10  Sanding march 2lb 2x10 some posterior LOB HS curls green 2x12 Sides steps over WaTE bar  Alt 4 in box taps x10 then from airex x10 Heel raises    12/12/22- EVAL     PATIENT EDUCATION:  Education details: POC and HEP Person educated: Patient Education method: Explanation Education comprehension: verbalized understanding  HOME EXERCISE PROGRAM: Access Code: [email or web address] URL: [email or web address] Date: 12/12/2022 Prepared by: Cassie Freer  Exercises -  Single Leg Stance  - 1 x daily - 7 x weekly - 2 sets - 10 hold - Tandem Stance  - 1 x daily - 7 x weekly - 2 sets - 30 hold - Sit to Stand  - 1 x daily - 7 x weekly - 2 sets - 10 reps - Seated Hip Abduction with Resistance  - 1 x daily - 7 x weekly - 2 sets - 10 reps - Seated Knee Extension with Resistance  - 1 x daily - 7 x weekly - 2 sets - 10 reps - Seated Knee Lifts with Resistance  - 1 x daily - 7 x weekly - 2 sets - 10 reps  ASSESSMENT:  CLINICAL IMPRESSION: The patient reported feeling no pain prior to or after the session. She wanted to work on balance so we did some leg strengthening and then mostly balance activities. She did well with progression from even to uneven, eyes open to closed, double leg to single leg and different stances. Verbal cues were needed during the balance exercises for correct technique which she demonstrated correctly. Overall her LE strength and balance are improving and she tolerated treatment well. She would benefit from continued PT to increase strength, improve balance, and improve coordination.  OBJECTIVE IMPAIRMENTS: decreased balance and decreased coordination.   REHAB POTENTIAL: Good  CLINICAL DECISION MAKING: Stable/uncomplicated  EVALUATION COMPLEXITY: Low   GOALS: Goals reviewed with patient? Yes  SHORT TERM GOALS: Target date: 01/09/23  Patient will be independent with initial HEP. Goal status: INITIAL   LONG TERM GOALS: Target date: 02/06/23  Patient will be independent with advanced/ongoing HEP to improve outcomes and carryover.  Goal status: INITIAL  2.  Patient will be able to do SLS on foam surface for more than 10s  Baseline: 6s  Goal status: INITIAL  3.  Patient will demonstrate improved functional LE strength as demonstrated by 5xSTS < 15s without UE push off. Baseline: 22.04s Goal status: INITIAL  4.  Patient will score 56 on Berg Balance test to demonstrate lower risk of falls. (MCID= 8 points) .  Baseline: 48 Goal  status: INITIAL  PLAN:  PT FREQUENCY: 1x/week  PT DURATION: 8 weeks  PLANNED INTERVENTIONS: Therapeutic exercises, Therapeutic activity, Neuromuscular re-education, Balance training, Gait training, Patient/Family education, Self Care, Joint mobilization, Stair training, Cryotherapy, Moist heat, and Manual therapy  PLAN FOR NEXT SESSION: lower extremity strengthening and balance   George Ina, SPTA 01/18/2023, 3:14 PM

## 2023-01-24 DIAGNOSIS — H26493 Other secondary cataract, bilateral: Secondary | ICD-10-CM | POA: Diagnosis not present

## 2023-01-24 DIAGNOSIS — Z961 Presence of intraocular lens: Secondary | ICD-10-CM | POA: Diagnosis not present

## 2023-01-24 DIAGNOSIS — Q141 Congenital malformation of retina: Secondary | ICD-10-CM | POA: Diagnosis not present

## 2023-01-25 ENCOUNTER — Ambulatory Visit: Payer: Medicare Other | Admitting: Physical Therapy

## 2023-01-25 DIAGNOSIS — R2689 Other abnormalities of gait and mobility: Secondary | ICD-10-CM | POA: Diagnosis not present

## 2023-01-25 DIAGNOSIS — M6281 Muscle weakness (generalized): Secondary | ICD-10-CM | POA: Diagnosis not present

## 2023-01-25 DIAGNOSIS — R2681 Unsteadiness on feet: Secondary | ICD-10-CM

## 2023-01-25 NOTE — Therapy (Signed)
[facility] at [facility] [address] [facility] [address] [place], [place], [address] Phone: [phone]   Fax:  [phone]  Patient Details  Name: Bonnie Cowan MRN: [id number] Date of Birth: [birth year] Referring Provider:  Farris Has, MD  Encounter Date: 01/25/2023   Bonnie Cowan, SPTA OUTPATIENT PHYSICAL THERAPY LOWER EXTREMITY TREATMENT   Patient Name: Bonnie Cowan MRN: [id number] DOB:13-Apr-1941, 82 y.o., female Today's Date: 01/25/2023  END OF SESSION:  PT End of Session - 01/25/23 1512     Visit Number 6    Date for PT Re-Evaluation 02/06/23    PT Start Time 1515    PT Stop Time 1600    PT Time Calculation (min) 45 min    Activity Tolerance Patient tolerated treatment well    Behavior During Therapy 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 No  PRECAUTIONS: None  WEIGHT BEARING RESTRICTIONS: No  FALLS:  Has patient fallen in last 6 months? No  LIVING  ENVIRONMENT: Lives with: lives with their spouse Lives in: House/apartment Stairs: Yes: Internal: 15 steps; on right going up Has following equipment at home: None  OCCUPATION: Retired  PLOF: Independent  PATIENT GOALS: be more steady and feel safe in the yard   OBJECTIVE:   COGNITION: Overall cognitive status: Within functional limits for tasks assessed     SENSATION: WFL  POSTURE: rounded shoulders  LOWER EXTREMITY ROM: grossly WFL    LOWER EXTREMITY MMT: grossly 4/5    FUNCTIONAL TESTS:  5 times sit to stand: 22.04s Timed up and go (TUG): 12.25s Berg Balance Scale: 48/56   TODAY'S TREATMENT:                                                                                                                              DATE:   01/25/23 NuStep L5 x 6 mins  Checked goals Lat pull downs 15lb 2x10 Rows 10lb 2x10 DLS airex with ball taps 4V40 SLS with cone taps x1 L; x10 R 6 in step ups from airex 2x10 R; 2x10 L Stepping over objects     01/18/23 Nustep L5 x 6 mins HS curls 20# 2x10 Knee ext 5# 2x10 Standing marches B 3lb 2x10 Heel taps 4 in x10 Step ups 4 in with R x10; L x10 DLS on airex with eyes closed in different directions with nudges x3each way SLS with L and R 10 sec x3 Tandem stance 10 sec x 3 each L and R; min LOB Tandem walking in // bars forward/backward x3; on balance beam x3 Step ups airex pad to 6 in with R 2x10; L 2x10; min LOB Standing PF stretch on slant board   01/04/23 Nustep L5 x Black bar DF/PF 2x10 STS on airex 2x10 SLS L to R hold for 10 seconds x3 SLS alternating cone taps x20 SLS L to R on hod for 5 seconds x3  Resisted gait 20lb all directions x 3 Step ups airex pad to 6 in step x10 Side to side steps airex pad x10   12/28/22 Bike L2 x71mins STS on airex 2x10 Step up on airex CGA LAQ 3# 2x12 HS curls green 2x12 Hip abd green band seated 2x10 Ball squeezes 2x10 Calf raises 2x10  Walking on beam   12/19/22 NuStep  L4 x 6 min S2S holding red ball 2x10 LAQ 2lb 2x10  Sanding march 2lb 2x10 some posterior LOB HS curls green 2x12 Sides steps over WaTE bar  Alt 4 in box taps x10 then from airex x10 Heel raises    12/12/22- EVAL     PATIENT EDUCATION:  Education details: POC and HEP Person educated: Patient Education method: Explanation Education comprehension: verbalized understanding  HOME EXERCISE PROGRAM: Access Code: [email or web address] URL: [email or web address] Date: 12/12/2022 Prepared by: Cassie Freer  Exercises - Single Leg Stance  - 1 x daily - 7 x weekly - 2 sets - 10 hold - Tandem Stance  - 1 x daily - 7 x weekly - 2 sets - 30 hold - Sit to Stand  - 1 x daily - 7 x weekly - 2 sets - 10 reps - Seated Hip Abduction with Resistance  - 1 x daily - 7 x weekly - 2 sets - 10 reps - Seated Knee Extension with Resistance  - 1 x daily - 7 x weekly - 2 sets - 10 reps - Seated Knee Lifts with Resistance  - 1 x daily - 7 x weekly - 2 sets - 10 reps  ASSESSMENT:  CLINICAL IMPRESSION: The patient reported feeling no pain prior to or after the session. We check her goals and she met STG 1 (I with HEP) and LTG 2 (SLS on foam surface for more than 10s). She is making progress toward all other goals. Pt wanted to focus on balance and general strength this session, so we did some UE strengthening and balance activities. Verbal cues needed during lat pull downs for proper form to target the correct muscles, which were received well. She tolerated treatment well and would benefit from continued PT to improve strength and balance.    OBJECTIVE IMPAIRMENTS: decreased balance and decreased coordination.   REHAB POTENTIAL: Good  CLINICAL DECISION MAKING: Stable/uncomplicated  EVALUATION COMPLEXITY: Low   GOALS: Goals reviewed with patient? Yes  SHORT TERM GOALS: Target date: 01/09/23  Patient will be independent with initial HEP. Goal status:  Met 01/25/23   LONG TERM GOALS: Target date:  02/06/23  Patient will be independent with advanced/ongoing HEP to improve outcomes and carryover.  Goal status:ongoing 01/25/23  2.  Patient will be able to do SLS on foam surface for more than 10s  Baseline: 6s  Goal status: Met 01/25/23  3.  Patient will demonstrate improved functional LE strength as demonstrated by 5xSTS < 15s without UE push off. Baseline: 22.04s Goal status: 16 sec progressing 01/25/23  4.  Patient will score 56 on Berg Balance test to demonstrate lower risk of falls. (MCID= 8 points) .  Baseline: 48 Goal status: 54 progressing 01/25/23   PLAN:  PT FREQUENCY: 1x/week  PT DURATION: 8 weeks  PLANNED INTERVENTIONS: Therapeutic exercises, Therapeutic activity, Neuromuscular re-education, Balance training, Gait training, Patient/Family education, Self Care, Joint mobilization, Stair training, Cryotherapy, Moist heat, and Manual therapy  PLAN FOR NEXT SESSION: lower extremity strengthening and balance   Bonnie Cowan, SPTA 01/25/2023, 3:13 PM

## 2023-01-31 DIAGNOSIS — M810 Age-related osteoporosis without current pathological fracture: Secondary | ICD-10-CM | POA: Diagnosis not present

## 2023-02-01 ENCOUNTER — Ambulatory Visit: Payer: Medicare Other

## 2023-02-01 DIAGNOSIS — M6281 Muscle weakness (generalized): Secondary | ICD-10-CM

## 2023-02-01 DIAGNOSIS — R2689 Other abnormalities of gait and mobility: Secondary | ICD-10-CM

## 2023-02-01 DIAGNOSIS — R2681 Unsteadiness on feet: Secondary | ICD-10-CM

## 2023-02-01 NOTE — Therapy (Signed)
[facility] at [facility]. [place], [place], [address] Phone: [phone]   Fax:  [phone]  Patient Details  Name: Bonnie Cowan MRN: [id number] Date of Birth: September 08, 1940 Referring Provider:  Farris Has, MD  Encounter Date: 02/01/2023   George Ina, SPTA OUTPATIENT PHYSICAL THERAPY LOWER EXTREMITY TREATMENT   Patient Name: Bonnie Cowan MRN: [id number] DOB:24-Aug-1940, 82 y.o., female Today's Date: 02/01/2023  END OF SESSION:  PT End of Session - 02/01/23 1528     Visit Number 7    Date for PT Re-Evaluation 02/06/23    PT Start Time 1530    PT Stop Time 1615    PT Time Calculation (min) 45 min    Activity Tolerance Patient tolerated treatment well    Behavior During Therapy [facility] for tasks assessed/performed              Past Medical History:  Diagnosis Date   Arthritis    Cataract    OU   Cold    GERD (gastroesophageal reflux disease)    Meniscus tear    right knee   Past Surgical History:  Procedure Laterality Date   ABDOMINAL HYSTERECTOMY     TOTAL KNEE ARTHROPLASTY Right 09/25/2015   Procedure: RIGHT TOTAL KNEE ARTHROPLASTY;  Surgeon: Beverely Low, MD;  Location: [facility];  Service: Orthopedics;  Laterality: Right;   Patient Active Problem List   Diagnosis Date Noted   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 No  PRECAUTIONS: None  WEIGHT BEARING RESTRICTIONS: No  FALLS:  Has patient fallen in last 6 months? No  LIVING  ENVIRONMENT: Lives with: lives with their spouse Lives in: House/apartment Stairs: Yes: Internal: 15 steps; on right going up Has following equipment at home: None  OCCUPATION: Retired  PLOF: Independent  PATIENT GOALS: be more steady and feel safe in the yard   OBJECTIVE:   COGNITION: Overall cognitive status: Within functional limits for tasks assessed     SENSATION: WFL  POSTURE: rounded shoulders  LOWER EXTREMITY ROM: grossly WFL    LOWER EXTREMITY MMT: grossly 4/5    FUNCTIONAL TESTS:  5 times sit to stand: 22.04s Timed up and go (TUG): 12.25s Berg Balance Scale: 48/56   TODAY'S TREATMENT:                                                                                                                              DATE:   02/01/23 Bike L3 x  HS curls 20# 2x10 Knee ext 5# 2x10 Tandem on beam x5 Sidestep on beam x5 SLS on airex 30secs x3 SLS on airex witih cone taps 2x15 Side step on/off airex x15 Step ups airex to 6in x10 Catch and throw/taps on airex  Walking forward/backward/side step while playing catch  STS 2x10   01/25/23 NuStep L5 x 6 mins Checked goals Lat pull downs 15lb 2x10 Rows 10lb 2x10 DLS airex with ball taps 4U98 SLS with cone taps x1 L; x10 R 6 in step ups from airex 2x10 R; 2x10 L Stepping over objects     01/18/23 Nustep L5 x 6 mins HS curls 20# 2x10 Knee ext 5# 2x10 Standing marches B 3lb 2x10 Heel taps 4 in x10 Step ups 4 in with R x10; L x10 DLS on airex with eyes closed in different directions with nudges x3each way SLS with L and R 10 sec x3 Tandem stance 10 sec x 3 each L and R; min LOB Tandem walking in // bars forward/backward x3; on balance beam x3 Step ups airex pad to 6 in with R 2x10; L 2x10; min LOB Standing PF stretch on slant board   01/04/23 Nustep L5 x Black bar DF/PF 2x10 STS on airex 2x10 SLS L to R hold for 10 seconds x3 SLS alternating cone taps x20 SLS L to R on hod for 5 seconds x3   Resisted gait 20lb all directions x 3 Step ups airex pad to 6 in step x10 Side to side steps airex pad x10   12/28/22 Bike L2 x70mins STS on airex 2x10 Step up on airex CGA LAQ 3# 2x12 HS curls green 2x12 Hip abd green band seated 2x10 Ball squeezes 2x10 Calf raises 2x10  Walking on beam   12/19/22 NuStep L4 x 6 min S2S holding red ball 2x10 LAQ 2lb 2x10  Sanding march 2lb 2x10 some posterior LOB HS curls green 2x12 Sides steps over WaTE bar  Alt 4 in box taps x10 then from airex x10 Heel raises    12/12/22- EVAL     PATIENT EDUCATION:  Education details: POC and HEP Person educated: Patient Education method: Explanation Education comprehension: verbalized understanding  HOME EXERCISE PROGRAM: Access Code: [email or web address] URL: [email or web address] Date: 12/12/2022 Prepared by: Cassie Freer  Exercises - Single Leg Stance  - 1 x daily - 7 x weekly - 2 sets - 10 hold - Tandem Stance  - 1 x daily - 7 x weekly - 2 sets - 30 hold - Sit to Stand  - 1 x daily - 7 x weekly - 2 sets - 10 reps - Seated Hip Abduction with Resistance  - 1 x daily - 7 x weekly - 2 sets - 10 reps - Seated Knee Extension with Resistance  - 1 x daily - 7 x weekly - 2 sets - 10 reps - Seated Knee Lifts with Resistance  - 1 x daily - 7 x weekly - 2 sets - 10 reps  ASSESSMENT:  CLINICAL IMPRESSION: Patient entered in no pain and nothing new to report. Treatment consisted of LE strengthening, gt, and balance exercises which were all tolerated well by pt. Gt and balance activities were performed in // bars with CGA. Pt's balance and strength is progressing really well, with minimal to no LOB during all activities. She tolerated treatment well and would benefit from continued PT to keep progressing her balance and stability.   OBJECTIVE IMPAIRMENTS: decreased balance and decreased coordination.   REHAB POTENTIAL: Good  CLINICAL DECISION MAKING: Stable/uncomplicated  EVALUATION COMPLEXITY:  Low   GOALS: Goals reviewed with patient? Yes  SHORT TERM GOALS: Target date: 01/09/23  Patient will be independent with initial HEP. Goal status: Met 01/25/23   LONG TERM GOALS: Target date: 02/06/23  Patient will be independent with advanced/ongoing HEP to improve outcomes and carryover.  Goal status:ongoing 01/25/23  2.  Patient will be able to do SLS on foam surface for more than 10s  Baseline: 6s  Goal status: Met 01/25/23  3.  Patient will demonstrate improved functional LE strength as demonstrated by 5xSTS < 15s without UE push off. Baseline: 22.04s Goal status: 16 sec progressing 01/25/23  4.  Patient will score 56 on Berg Balance test to demonstrate lower risk of falls. (MCID= 8 points) .  Baseline: 48 Goal status: 54 progressing 01/25/23   PLAN:  PT FREQUENCY: 1x/week  PT DURATION: 8 weeks  PLANNED INTERVENTIONS: Therapeutic exercises, Therapeutic activity, Neuromuscular re-education, Balance training, Gait training, Patient/Family education, Self Care, Joint mobilization, Stair training, Cryotherapy, Moist heat, and Manual therapy  PLAN FOR NEXT SESSION: lower extremity strengthening and balance   George Ina, SPTA 02/01/2023, 3:29 PM

## 2023-02-02 DIAGNOSIS — M810 Age-related osteoporosis without current pathological fracture: Secondary | ICD-10-CM | POA: Diagnosis not present

## 2023-02-06 ENCOUNTER — Ambulatory Visit: Payer: Medicare Other

## 2023-02-06 DIAGNOSIS — L821 Other seborrheic keratosis: Secondary | ICD-10-CM | POA: Diagnosis not present

## 2023-02-06 DIAGNOSIS — R2689 Other abnormalities of gait and mobility: Secondary | ICD-10-CM | POA: Diagnosis not present

## 2023-02-06 DIAGNOSIS — R2681 Unsteadiness on feet: Secondary | ICD-10-CM | POA: Diagnosis not present

## 2023-02-06 DIAGNOSIS — M6281 Muscle weakness (generalized): Secondary | ICD-10-CM

## 2023-02-06 DIAGNOSIS — D225 Melanocytic nevi of trunk: Secondary | ICD-10-CM | POA: Diagnosis not present

## 2023-02-06 DIAGNOSIS — T490X5A Adverse effect of local antifungal, anti-infective and anti-inflammatory drugs, initial encounter: Secondary | ICD-10-CM | POA: Diagnosis not present

## 2023-02-06 DIAGNOSIS — L814 Other melanin hyperpigmentation: Secondary | ICD-10-CM | POA: Diagnosis not present

## 2023-02-06 DIAGNOSIS — L853 Xerosis cutis: Secondary | ICD-10-CM | POA: Diagnosis not present

## 2023-02-06 DIAGNOSIS — L570 Actinic keratosis: Secondary | ICD-10-CM | POA: Diagnosis not present

## 2023-02-06 DIAGNOSIS — L720 Epidermal cyst: Secondary | ICD-10-CM | POA: Diagnosis not present

## 2023-02-06 NOTE — Therapy (Signed)
[facility] [place], [place], [address] Phone: [phone]   Fax:  [phone]  Patient Details  Name: Bonnie Cowan MRN: [id number] Date of Birth: 1940/12/06 Referring Provider:  Farris Has, MD  Encounter Date: 02/06/2023   Cassie Freer, PT, SPTA OUTPATIENT PHYSICAL THERAPY LOWER EXTREMITY TREATMENT   Patient Name: Bonnie Cowan MRN: [id number] DOB:25-Jul-1940, 82 y.o., female Today's Date: 02/06/2023  END OF SESSION:  PT End of Session - 02/06/23 1514     Visit Number 8    Date for PT Re-Evaluation 02/06/23    PT Start Time 1515    PT Stop Time 1555    PT Time Calculation (min) 40 min    Activity Tolerance Patient tolerated treatment well    Behavior During Therapy [facility] for tasks assessed/performed               Past Medical History:  Diagnosis Date   Arthritis    Cataract    OU   Cold    GERD (gastroesophageal reflux disease)    Meniscus tear    right knee   Past Surgical History:  Procedure Laterality Date   ABDOMINAL HYSTERECTOMY     TOTAL KNEE ARTHROPLASTY Right 09/25/2015   Procedure: RIGHT TOTAL KNEE ARTHROPLASTY;  Surgeon: Beverely Low, MD;  Location: [facility] OR;  Service: Orthopedics;  Laterality: Right;   Patient Active Problem List   Diagnosis Date Noted   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 No  PRECAUTIONS: None  WEIGHT BEARING RESTRICTIONS: No  FALLS:  Has patient fallen in last 6 months? No  LIVING  ENVIRONMENT: Lives with: lives with their spouse Lives in: House/apartment Stairs: Yes: Internal: 15 steps; on right going up Has following equipment at home: None  OCCUPATION: Retired  PLOF: Independent  PATIENT GOALS: be more steady and feel safe in the yard   OBJECTIVE:   COGNITION: Overall cognitive status: Within functional limits for tasks assessed     SENSATION: WFL  POSTURE: rounded shoulders  LOWER EXTREMITY ROM: grossly WFL    LOWER EXTREMITY MMT: grossly 4/5    FUNCTIONAL TESTS:  5 times sit to stand: 22.04s Timed up and go (TUG): 12.25s Berg Balance Scale: 48/56   TODAY'S TREATMENT:                                                                                                                              DATE:  02/06/23 NuStep L5  x40mins  STS goal BERG goal Resisted gait 20# 4 x4 way  Step up from airex to 6" Walking on beam Tandem on beam 30s SLS on beam 5s Rocker board Side steps on BOSU x5 each side    02/01/23 Bike L3 x HS curls 20# 2x10 Knee ext 5# 2x10 Tandem on beam x5 Sidestep on beam x5 SLS on airex 30secs x3 SLS on airex witih cone taps 2x15 Side step on/off airex x15 Step ups airex to 6in x10 Catch and throw/taps on airex  Walking forward/backward/side step while playing catch STS 2x10   01/25/23 NuStep L5 x 6 mins Checked goals Lat pull downs 15lb 2x10 Rows 10lb 2x10 DLS airex with ball taps [id number] SLS with cone taps x1 L; x10 R 6 in step ups from airex 2x10 R; 2x10 L Stepping over objects     01/18/23 Nustep L5 x 6 mins HS curls 20# 2x10 Knee ext 5# 2x10 Standing marches B 3lb 2x10 Heel taps 4 in x10 Step ups 4 in with R x10; L x10 DLS on airex with eyes closed in different directions with nudges x3each way SLS with L and R 10 sec x3 Tandem stance 10 sec x 3 each L and R; min LOB Tandem walking in // bars forward/backward x3; on balance beam x3 Step ups airex pad to 6 in with R 2x10; L 2x10; min  LOB Standing PF stretch on slant board   01/04/23 Nustep L5 x Black bar DF/PF 2x10 STS on airex 2x10 SLS L to R hold for 10 seconds x3 SLS alternating cone taps x20 SLS L to R on hod for 5 seconds x3  Resisted gait 20lb all directions x 3 Step ups airex pad to 6 in step x10 Side to side steps airex pad x10   12/28/22 Bike L2 x59mins STS on airex 2x10 Step up on airex CGA LAQ 3# 2x12 HS curls green 2x12 Hip abd green band seated 2x10 Ball squeezes 2x10 Calf raises 2x10  Walking on beam   12/19/22 NuStep L4 x 6 min S2S holding red ball 2x10 LAQ 2lb 2x10  Sanding march 2lb 2x10 some posterior LOB HS curls green 2x12 Sides steps over WaTE bar  Alt 4 in box taps x10 then from airex x10 Heel raises    12/12/22- EVAL     PATIENT EDUCATION:  Education details: POC and HEP Person educated: Patient Education method: Explanation Education comprehension: verbalized understanding  HOME EXERCISE PROGRAM: Access Code: [email or web address] URL: [email or web address] Date: 12/12/2022 Prepared by: Cassie Freer  Exercises - Single Leg Stance  - 1 x daily - 7 x weekly - 2 sets - 10 hold - Tandem Stance  - 1 x daily - 7 x weekly - 2 sets - 30 hold - Sit to Stand  - 1 x daily - 7 x weekly - 2 sets - 10 reps - Seated Hip Abduction with Resistance  - 1 x daily - 7 x weekly - 2 sets - 10 reps - Seated Knee Extension with Resistance  - 1 x daily - 7 x weekly - 2 sets - 10 reps - Seated Knee Lifts with Resistance  - 1 x daily - 7 x weekly - 2 sets - 10 reps  ASSESSMENT:  CLINICAL IMPRESSION: Patient entered in no pain and nothing new to report. She has met all of her goals. We worked on some harder, high level balance today and she does fairly well. Was advised to keep working on her  HEP and if it has gotten easier, I gave her some suggestions to challenge herself.   OBJECTIVE IMPAIRMENTS: decreased balance and decreased coordination.   REHAB POTENTIAL: Good  CLINICAL  DECISION MAKING: Stable/uncomplicated  EVALUATION COMPLEXITY: Low   GOALS: Goals reviewed with patient? Yes  SHORT TERM GOALS: Target date: 01/09/23  Patient will be independent with initial HEP. Goal status: Met 01/25/23   LONG TERM GOALS: Target date: 02/06/23  Patient will be independent with advanced/ongoing HEP to improve outcomes and carryover.  Goal status:ongoing 01/25/23  2.  Patient will be able to do SLS on foam surface for more than 10s  Baseline: 6s  Goal status: Met 01/25/23  3.  Patient will demonstrate improved functional LE strength as demonstrated by 5xSTS < 15s without UE push off. Baseline: 22.04s Goal status: 16 sec progressing 01/25/23, 11.96s MET 02/06/23  4.  Patient will score 56 on Berg Balance test to demonstrate lower risk of falls. (MCID= 8 points) .  Baseline: 48 Goal status: 54 progressing 01/25/23, 56/56 MET 02/06/23   PLAN:  PT FREQUENCY: 1x/week  PT DURATION: 8 weeks  PLANNED INTERVENTIONS: Therapeutic exercises, Therapeutic activity, Neuromuscular re-education, Balance training, Gait training, Patient/Family education, Self Care, Joint mobilization, Stair training, Cryotherapy, Moist heat, and Manual therapy  PLAN FOR NEXT SESSION: d/c  PHYSICAL THERAPY DISCHARGE SUMMARY  Visits from Start of Care: 8  Patient agrees to discharge. Patient goals were met. Patient is being discharged due to meeting the stated rehab goals.   [place], PT 02/06/2023, 3:55 PM

## 2023-04-10 DIAGNOSIS — Z23 Encounter for immunization: Secondary | ICD-10-CM | POA: Diagnosis not present

## 2023-04-17 DIAGNOSIS — Z23 Encounter for immunization: Secondary | ICD-10-CM | POA: Diagnosis not present

## 2023-07-11 DIAGNOSIS — M5441 Lumbago with sciatica, right side: Secondary | ICD-10-CM | POA: Diagnosis not present

## 2023-08-07 DIAGNOSIS — M810 Age-related osteoporosis without current pathological fracture: Secondary | ICD-10-CM | POA: Diagnosis not present

## 2023-08-09 DIAGNOSIS — L821 Other seborrheic keratosis: Secondary | ICD-10-CM | POA: Diagnosis not present

## 2023-08-09 DIAGNOSIS — D225 Melanocytic nevi of trunk: Secondary | ICD-10-CM | POA: Diagnosis not present

## 2023-08-09 DIAGNOSIS — L814 Other melanin hyperpigmentation: Secondary | ICD-10-CM | POA: Diagnosis not present

## 2023-08-14 DIAGNOSIS — M810 Age-related osteoporosis without current pathological fracture: Secondary | ICD-10-CM | POA: Diagnosis not present

## 2023-09-27 DIAGNOSIS — Q141 Congenital malformation of retina: Secondary | ICD-10-CM | POA: Diagnosis not present

## 2023-09-27 DIAGNOSIS — Z961 Presence of intraocular lens: Secondary | ICD-10-CM | POA: Diagnosis not present

## 2023-09-27 DIAGNOSIS — H26493 Other secondary cataract, bilateral: Secondary | ICD-10-CM | POA: Diagnosis not present

## 2023-09-27 DIAGNOSIS — H16223 Keratoconjunctivitis sicca, not specified as Sjogren's, bilateral: Secondary | ICD-10-CM | POA: Diagnosis not present

## 2023-10-10 DIAGNOSIS — H26492 Other secondary cataract, left eye: Secondary | ICD-10-CM | POA: Diagnosis not present

## 2023-10-12 DIAGNOSIS — M5430 Sciatica, unspecified side: Secondary | ICD-10-CM | POA: Diagnosis not present

## 2023-10-12 DIAGNOSIS — M549 Dorsalgia, unspecified: Secondary | ICD-10-CM | POA: Diagnosis not present

## 2023-10-17 DIAGNOSIS — Z961 Presence of intraocular lens: Secondary | ICD-10-CM | POA: Diagnosis not present

## 2023-10-17 DIAGNOSIS — H26491 Other secondary cataract, right eye: Secondary | ICD-10-CM | POA: Diagnosis not present

## 2023-11-03 DIAGNOSIS — M545 Low back pain, unspecified: Secondary | ICD-10-CM | POA: Diagnosis not present

## 2023-11-13 DIAGNOSIS — M545 Low back pain, unspecified: Secondary | ICD-10-CM | POA: Diagnosis not present

## 2023-11-15 DIAGNOSIS — M545 Low back pain, unspecified: Secondary | ICD-10-CM | POA: Diagnosis not present

## 2023-11-20 ENCOUNTER — Other Ambulatory Visit: Payer: Self-pay | Admitting: Family Medicine

## 2023-11-20 DIAGNOSIS — M7989 Other specified soft tissue disorders: Secondary | ICD-10-CM

## 2023-11-21 ENCOUNTER — Ambulatory Visit
Admission: RE | Admit: 2023-11-21 | Discharge: 2023-11-21 | Discharge status: Home / Self Care | Source: Ambulatory Visit | Attending: Family Medicine | Admitting: Family Medicine

## 2023-11-21 DIAGNOSIS — M79604 Pain in right leg: Secondary | ICD-10-CM | POA: Diagnosis not present

## 2023-11-21 DIAGNOSIS — M7989 Other specified soft tissue disorders: Secondary | ICD-10-CM

## 2023-11-27 DIAGNOSIS — M5459 Other low back pain: Secondary | ICD-10-CM | POA: Diagnosis not present

## 2023-11-27 DIAGNOSIS — M5441 Lumbago with sciatica, right side: Secondary | ICD-10-CM | POA: Diagnosis not present

## 2023-11-30 DIAGNOSIS — Z0000 Encounter for general adult medical examination without abnormal findings: Secondary | ICD-10-CM | POA: Diagnosis not present

## 2023-11-30 DIAGNOSIS — E785 Hyperlipidemia, unspecified: Secondary | ICD-10-CM | POA: Diagnosis not present

## 2023-11-30 DIAGNOSIS — I10 Essential (primary) hypertension: Secondary | ICD-10-CM | POA: Diagnosis not present

## 2023-12-05 DIAGNOSIS — M8580 Other specified disorders of bone density and structure, unspecified site: Secondary | ICD-10-CM | POA: Diagnosis not present

## 2023-12-05 DIAGNOSIS — R030 Elevated blood-pressure reading, without diagnosis of hypertension: Secondary | ICD-10-CM | POA: Diagnosis not present

## 2023-12-05 DIAGNOSIS — Z0000 Encounter for general adult medical examination without abnormal findings: Secondary | ICD-10-CM | POA: Diagnosis not present

## 2023-12-05 DIAGNOSIS — M5430 Sciatica, unspecified side: Secondary | ICD-10-CM | POA: Diagnosis not present

## 2023-12-05 DIAGNOSIS — E785 Hyperlipidemia, unspecified: Secondary | ICD-10-CM | POA: Diagnosis not present

## 2023-12-05 DIAGNOSIS — K219 Gastro-esophageal reflux disease without esophagitis: Secondary | ICD-10-CM | POA: Diagnosis not present

## 2023-12-21 DIAGNOSIS — M5416 Radiculopathy, lumbar region: Secondary | ICD-10-CM | POA: Diagnosis not present

## 2023-12-21 DIAGNOSIS — M5459 Other low back pain: Secondary | ICD-10-CM | POA: Diagnosis not present

## 2023-12-22 ENCOUNTER — Other Ambulatory Visit (HOSPITAL_COMMUNITY): Payer: Self-pay | Admitting: Physical Medicine and Rehabilitation

## 2023-12-22 DIAGNOSIS — M5459 Other low back pain: Secondary | ICD-10-CM

## 2024-01-02 DIAGNOSIS — M5459 Other low back pain: Secondary | ICD-10-CM | POA: Diagnosis not present

## 2024-01-20 DIAGNOSIS — M539 Dorsopathy, unspecified: Secondary | ICD-10-CM | POA: Diagnosis not present

## 2024-01-20 DIAGNOSIS — M48062 Spinal stenosis, lumbar region with neurogenic claudication: Secondary | ICD-10-CM | POA: Diagnosis not present

## 2024-01-31 DIAGNOSIS — M810 Age-related osteoporosis without current pathological fracture: Secondary | ICD-10-CM | POA: Diagnosis not present

## 2024-02-08 DIAGNOSIS — M5416 Radiculopathy, lumbar region: Secondary | ICD-10-CM | POA: Diagnosis not present

## 2024-02-12 DIAGNOSIS — M810 Age-related osteoporosis without current pathological fracture: Secondary | ICD-10-CM | POA: Diagnosis not present

## 2024-02-19 DIAGNOSIS — E782 Mixed hyperlipidemia: Secondary | ICD-10-CM | POA: Diagnosis not present

## 2024-02-19 DIAGNOSIS — I10 Essential (primary) hypertension: Secondary | ICD-10-CM | POA: Diagnosis not present

## 2024-02-19 DIAGNOSIS — M810 Age-related osteoporosis without current pathological fracture: Secondary | ICD-10-CM | POA: Diagnosis not present

## 2024-02-22 DIAGNOSIS — M5416 Radiculopathy, lumbar region: Secondary | ICD-10-CM | POA: Diagnosis not present

## 2024-02-22 DIAGNOSIS — M48062 Spinal stenosis, lumbar region with neurogenic claudication: Secondary | ICD-10-CM | POA: Diagnosis not present

## 2024-03-05 DIAGNOSIS — M5416 Radiculopathy, lumbar region: Secondary | ICD-10-CM | POA: Diagnosis not present

## 2024-03-07 DIAGNOSIS — M545 Low back pain, unspecified: Secondary | ICD-10-CM | POA: Diagnosis not present

## 2024-03-07 DIAGNOSIS — M48062 Spinal stenosis, lumbar region with neurogenic claudication: Secondary | ICD-10-CM | POA: Diagnosis not present

## 2024-03-15 DIAGNOSIS — M545 Low back pain, unspecified: Secondary | ICD-10-CM | POA: Diagnosis not present

## 2024-03-15 DIAGNOSIS — M48062 Spinal stenosis, lumbar region with neurogenic claudication: Secondary | ICD-10-CM | POA: Diagnosis not present

## 2024-03-20 DIAGNOSIS — M48062 Spinal stenosis, lumbar region with neurogenic claudication: Secondary | ICD-10-CM | POA: Diagnosis not present

## 2024-03-20 DIAGNOSIS — M545 Low back pain, unspecified: Secondary | ICD-10-CM | POA: Diagnosis not present

## 2024-03-27 DIAGNOSIS — M48062 Spinal stenosis, lumbar region with neurogenic claudication: Secondary | ICD-10-CM | POA: Diagnosis not present

## 2024-03-27 DIAGNOSIS — M545 Low back pain, unspecified: Secondary | ICD-10-CM | POA: Diagnosis not present

## 2024-04-04 DIAGNOSIS — M545 Low back pain, unspecified: Secondary | ICD-10-CM | POA: Diagnosis not present

## 2024-04-04 DIAGNOSIS — M48062 Spinal stenosis, lumbar region with neurogenic claudication: Secondary | ICD-10-CM | POA: Diagnosis not present

## 2024-04-15 DIAGNOSIS — M545 Low back pain, unspecified: Secondary | ICD-10-CM | POA: Diagnosis not present

## 2024-04-15 DIAGNOSIS — M48062 Spinal stenosis, lumbar region with neurogenic claudication: Secondary | ICD-10-CM | POA: Diagnosis not present

## 2024-04-23 DIAGNOSIS — M545 Low back pain, unspecified: Secondary | ICD-10-CM | POA: Diagnosis not present

## 2024-04-23 DIAGNOSIS — M48062 Spinal stenosis, lumbar region with neurogenic claudication: Secondary | ICD-10-CM | POA: Diagnosis not present

## 2024-04-24 DIAGNOSIS — Z23 Encounter for immunization: Secondary | ICD-10-CM | POA: Diagnosis not present

## 2024-04-29 DIAGNOSIS — M48062 Spinal stenosis, lumbar region with neurogenic claudication: Secondary | ICD-10-CM | POA: Diagnosis not present

## 2024-04-29 DIAGNOSIS — M545 Low back pain, unspecified: Secondary | ICD-10-CM | POA: Diagnosis not present

## 2024-05-02 DIAGNOSIS — L111 Transient acantholytic dermatosis [Grover]: Secondary | ICD-10-CM | POA: Diagnosis not present

## 2024-05-07 DIAGNOSIS — M48062 Spinal stenosis, lumbar region with neurogenic claudication: Secondary | ICD-10-CM | POA: Diagnosis not present

## 2024-05-07 DIAGNOSIS — M545 Low back pain, unspecified: Secondary | ICD-10-CM | POA: Diagnosis not present

## 2024-05-23 DIAGNOSIS — M545 Low back pain, unspecified: Secondary | ICD-10-CM | POA: Diagnosis not present

## 2024-05-23 DIAGNOSIS — M48062 Spinal stenosis, lumbar region with neurogenic claudication: Secondary | ICD-10-CM | POA: Diagnosis not present
